# Patient Record
Sex: Female | Born: 1955 | Race: Black or African American | Hispanic: No | Marital: Single | Smoking: Never smoker
Health system: Southern US, Community
[De-identification: ages and names within clinical notes are randomized; demographics above are authoritative.]

## PROBLEM LIST (undated history)

## (undated) DIAGNOSIS — E079 Disorder of thyroid, unspecified: Secondary | ICD-10-CM

## (undated) DIAGNOSIS — K219 Gastro-esophageal reflux disease without esophagitis: Secondary | ICD-10-CM

## (undated) DIAGNOSIS — E0590 Thyrotoxicosis, unspecified without thyrotoxic crisis or storm: Secondary | ICD-10-CM

## (undated) DIAGNOSIS — I071 Rheumatic tricuspid insufficiency: Secondary | ICD-10-CM

## (undated) DIAGNOSIS — E7801 Familial hypercholesterolemia: Secondary | ICD-10-CM

## (undated) DIAGNOSIS — E78019 Familial hypercholesterolemia, unspecified: Secondary | ICD-10-CM

## (undated) DIAGNOSIS — Z6830 Body mass index (BMI) 30.0-30.9, adult: Secondary | ICD-10-CM

## (undated) DIAGNOSIS — R0789 Other chest pain: Secondary | ICD-10-CM

## (undated) DIAGNOSIS — I340 Nonrheumatic mitral (valve) insufficiency: Secondary | ICD-10-CM

## (undated) DIAGNOSIS — I341 Nonrheumatic mitral (valve) prolapse: Secondary | ICD-10-CM

## (undated) DIAGNOSIS — D860 Sarcoidosis of lung: Secondary | ICD-10-CM

## (undated) HISTORY — PX: OTHER SURGICAL HISTORY: SHX169

## (undated) HISTORY — DX: Body mass index (BMI) 30.0-30.9, adult: Z68.30

## (undated) HISTORY — DX: Sarcoidosis of lung: D86.0

## (undated) HISTORY — DX: Thyrotoxicosis, unspecified without thyrotoxic crisis or storm: E05.90

## (undated) HISTORY — DX: Gastro-esophageal reflux disease without esophagitis: K21.9

## (undated) HISTORY — DX: Nonrheumatic mitral (valve) prolapse: I34.1

## (undated) HISTORY — DX: Nonrheumatic mitral (valve) insufficiency: I34.0

## (undated) HISTORY — DX: Familial hypercholesterolemia, unspecified: E78.019

## (undated) HISTORY — DX: Rheumatic tricuspid insufficiency: I07.1

## (undated) HISTORY — DX: Familial hypercholesterolemia: E78.01

---
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
## 2020-05-09 ENCOUNTER — Encounter: Payer: Self-pay | Admitting: Cardiology

## 2020-05-09 ENCOUNTER — Ambulatory Visit: Payer: Medicare Other | Admitting: Cardiology

## 2020-05-09 ENCOUNTER — Other Ambulatory Visit: Payer: Self-pay

## 2020-05-09 VITALS — BP 130/76 | HR 80 | Ht 65.0 in | Wt 238.4 lb

## 2020-05-09 DIAGNOSIS — D869 Sarcoidosis, unspecified: Secondary | ICD-10-CM

## 2020-05-09 DIAGNOSIS — I341 Nonrheumatic mitral (valve) prolapse: Secondary | ICD-10-CM | POA: Diagnosis not present

## 2020-05-09 DIAGNOSIS — R319 Hematuria, unspecified: Secondary | ICD-10-CM

## 2020-05-09 DIAGNOSIS — R42 Dizziness and giddiness: Secondary | ICD-10-CM | POA: Diagnosis not present

## 2020-05-09 DIAGNOSIS — K219 Gastro-esophageal reflux disease without esophagitis: Secondary | ICD-10-CM

## 2020-05-09 DIAGNOSIS — I10 Essential (primary) hypertension: Secondary | ICD-10-CM

## 2020-05-09 NOTE — Progress Notes (Signed)
Cardiology Office Note:    Date:  05/09/2020   ID:  Diana Calhoun, DOB 04/05/1956, MRN [id number]  PCP:  Buzzy Han, MD  [facility] Cardiologist:  Ena Dawley, MD  [place] Electrophysiologist:  None   Referring MD: No ref. provider found   Chief complaint: Shortness of breath, dizziness  History of Present Illness:    Diana Calhoun is a 64 y.o. female with a hx of sarcoidosis, mitral valve prolapse, GERD, who recently moved here from [facility].  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  She has been followed by cardiology is in [facility] until she moved here.  Most recently she underwent calcium scoring in January of this year that showed calcium score of 0.  She was previously followed by pulmonologist for pulmonary sarcoidosis currently on no medication but previously on oral prednisone.  She underwent echocardiography in 2019 that showed LVEF 55%, no wall motion abnormalities, mildly dilated left atrium, mild to moderate mitral regurgitation, mild tricuspid regurgitation with mildly elevated right-sided pressures with RVSP of 41 mmHg.  She states that she is experiencing symptoms of dyspnea on exertion however she attributes it to weight gain since her retirement 5 months ago.  This last weekend she has been experiencing lower back pain with hematuria.  No history of kidney stone.  She just found a primary care physician in [place] area and has appointment scheduled for July 7.  Past Medical History:  Diagnosis Date  . Body mass index (BMI) of 30.0-30.9 in adult   . Chest discomfort   . Familial hypercholesterolemia   . GERD (gastroesophageal reflux disease)   . Mitral valve regurgitation   . Pulmonary sarcoidosis (HCC)    Current Medications: Current Meds  Medication Sig  . fluticasone furoate-vilanterol (BREO ELLIPTA) 100-25 MCG/INH AEPB Place 1 puff into  the nose in the morning and at bedtime.  . montelukast (SINGULAIR) 10 MG tablet Take 10 mg by mouth at bedtime as needed.   . naproxen sodium (ALEVE) 220 MG tablet Take 220 mg by mouth as needed.  . pantoprazole (PROTONIX) 40 MG tablet Take 40 mg by mouth as needed.   . predniSONE (DELTASONE) 20 MG tablet Take 20 mg by mouth as needed.    Allergies:   Patient has no known allergies.   Social History   Socioeconomic History  . Marital status: Single    Spouse name: Not on file  . Number of children: Not on file  . Years of education: Not on file  . Highest education level: Not on file  Occupational History  . Not on file  Tobacco Use  . Smoking status: Never Smoker  . Smokeless tobacco: Never Used  Substance and Sexual Activity  . Alcohol use: Not on file  . Drug use: Not on file  . Sexual activity: Not on file  Other Topics Concern  . Not on file  Social History Narrative  . Not on file   Social Determinants of Health   Financial Resource Strain:   . Difficulty of Paying Living Expenses:   Food Insecurity:   . Worried About Charity fundraiser in the Last Year:   . Arboriculturist in the Last Year:   Transportation Needs:   . Film/video editor (Medical):   Marland Kitchen Lack of Transportation (Non-Medical):   Physical Activity:   . Days of Exercise per Week:   . Minutes of Exercise per Session:  Stress:   . Feeling of Stress :   Social Connections:   . Frequency of Communication with Friends and Family:   . Frequency of Social Gatherings with Friends and Family:   . Attends Religious Services:   . Active Member of Clubs or Organizations:   . Attends Archivist Meetings:   Marland Kitchen Marital Status:      Family History: The patient's family history includes Diabetes in her sister and sister; Hypertension in her mother.  ROS:   Please see the history of present illness.    All other systems reviewed and are negative.  EKGs/Labs/Other Studies Reviewed:    The  following studies were reviewed today:  EKG:  EKG is ordered today.  The ekg ordered today demonstrates normal sinus rhythm, normal EKG, no prior EKG available for comparison.  This was personally reviewed.  Recent Labs: No results found for requested labs within last 8760 hours.  Recent Lipid Panel No results found for: CHOL, TRIG, HDL, CHOLHDL, VLDL, LDLCALC, LDLDIRECT  Physical Exam:    VS:  BP 130/76   Pulse 80   Ht '5\' 5"'$  (1.651 m)   Wt 238 lb 6.4 oz (108.1 kg)   SpO2 96%   BMI 39.67 kg/m     Wt Readings from Last 3 Encounters:  05/09/20 238 lb 6.4 oz (108.1 kg)    GEN:  Well nourished, well developed in no acute distress HEENT: Normal NECK: No JVD; No carotid bruits LYMPHATICS: No lymphadenopathy CARDIAC: RRR, 3/6 systolic murmurs, rubs, gallops RESPIRATORY:  Clear to auscultation without rales, wheezing or rhonchi  ABDOMEN: Soft, non-tender, non-distended MUSCULOSKELETAL:  No edema; No deformity  SKIN: Warm and dry NEUROLOGIC:  Alert and oriented x 3 PSYCHIATRIC:  Normal affect    ASSESSMENT:    1. MVP (mitral valve prolapse)   2. Sarcoidosis   3. Dizziness   4. Essential hypertension   5. Gastroesophageal reflux disease, unspecified whether esophagitis present   6. Hematuria, unspecified type    PLAN:    In order of problems listed above:  1. Mitral valve prolapse with mild to moderate mitral regurgitation in 2019, we will repeat echocardiogram to get a baseline echocardiogram at [facility]. 2. Pulmonary sarcoidosis, no evidence of cardiac sarcoidosis, she has no palpitations or evidence for ventricular tachycardia's, if she starts having any we will obtain cardiac MRI to evaluate for cardiac involvement.  We will refer to pulmonary. 3. Poorly controlled GERD with esophagitis, will refer to a GI doctor. 4. Family history of brain aneurysm -with some neck pain, will obtain carotid ultrasound.   Medication Adjustments/Labs and Tests Ordered: Current medicines  are reviewed at length with the patient today.  Concerns regarding medicines are outlined above.  Orders Placed This Encounter  Procedures  . Comp Met (CMET)  . CBC  . TSH  . Lipid Profile  . Urinalysis  . Ambulatory referral to Pulmonology  . Ambulatory referral to Gastroenterology  . EKG 12-Lead  . ECHOCARDIOGRAM COMPLETE  . VAS US CAROTID   No orders of the defined types were placed in this encounter.   Patient Instructions  Medication Instructions:   Your physician recommends that you continue on your current medications as directed. Please refer to the Current Medication list given to you today.  *If you need a refill on your cardiac medications before your next appointment, please call your pharmacy*   Lab Work:  SAME DAY AS YOUR ECHO IS SCHEDULED--WE WILL CHECK URINALYSIS, CMET, CBC, TSH, AND LIPIDS--PLEASE  COME FASTING TO THIS LAB APPOINTMENT  If you have labs (blood work) drawn today and your tests are completely normal, you will receive your results only by: Marland Kitchen MyChart Message (if you have MyChart) OR . A paper copy in the mail If you have any lab test that is abnormal or we need to change your treatment, we will call you to review the results.  Testing/Procedures:  Your physician has requested that you have an echocardiogram. Echocardiography is a painless test that uses sound waves to create images of your heart. It provides your doctor with information about the size and shape of your heart and how well your heart's chambers and valves are working. This procedure takes approximately one hour. There are no restrictions for this procedure. PLEASE SCHEDULE LABS SAME DAY AS THIS APPOINTMENT  Your physician has requested that you have a carotid duplex. This test is an ultrasound of the carotid arteries in your neck. It looks at blood flow through these arteries that supply the brain with blood. Allow one hour for this exam. There are no restrictions or special  instructions.  You have been referred to [place] have been referred to [place] [facility]   Follow-Up: At [facility], you and your health needs are our priority.  As part of our continuing mission to provide you with exceptional heart care, we have created designated Provider Care Teams.  These Care Teams include your primary Cardiologist (physician) and Advanced Practice Providers (APPs -  Physician Assistants and Nurse Practitioners) who all work together to provide you with the care you need, when you need it.  We recommend signing up for the patient portal called "MyChart".  Sign up information is provided on this After Visit Summary.  MyChart is used to connect with patients for Virtual Visits (Telemedicine).  Patients are able to view lab/test results, encounter notes, upcoming appointments, etc.  Non-urgent messages can be sent to your provider as well.   To learn more about what you can do with MyChart, go to [email or web address].    Your next appointment:   12 month(s)  The format for your next appointment:   In Person  Provider:   Ena Dawley, MD       Signed, Ena Dawley, MD  05/09/2020 1:32 PM    [place]

## 2020-05-09 NOTE — Patient Instructions (Signed)
Medication Instructions:   Your physician recommends that you continue on your current medications as directed. Please refer to the Current Medication list given to you today.  *If you need a refill on your cardiac medications before your next appointment, please call your pharmacy*   Lab Work:  SAME DAY AS YOUR ECHO IS SCHEDULED--WE WILL CHECK URINALYSIS, CMET, CBC, TSH, AND LIPIDS--PLEASE COME FASTING TO THIS LAB APPOINTMENT  If you have labs (blood work) drawn today and your tests are completely normal, you will receive your results only by: Marland Kitchen MyChart Message (if you have MyChart) OR . A paper copy in the mail If you have any lab test that is abnormal or we need to change your treatment, we will call you to review the results.  Testing/Procedures:  Your physician has requested that you have an echocardiogram. Echocardiography is a painless test that uses sound waves to create images of your heart. It provides your doctor with information about the size and shape of your heart and how well your heart's chambers and valves are working. This procedure takes approximately one hour. There are no restrictions for this procedure. PLEASE SCHEDULE LABS SAME DAY AS THIS APPOINTMENT  Your physician has requested that you have a carotid duplex. This test is an ultrasound of the carotid arteries in your neck. It looks at blood flow through these arteries that supply the brain with blood. Allow one hour for this exam. There are no restrictions or special instructions.  You have been referred to [facility] PULMONOLOGY FOR SARCOIDOSIS  You have been referred to [facility] GASTROENTEROLOGY FOR GERD   Follow-Up: At [facility], you and your health needs are our priority.  As part of our continuing mission to provide you with exceptional heart care, we have created designated Provider Care Teams.  These Care Teams include your primary Cardiologist (physician) and Advanced Practice Providers (APPs -   Physician Assistants and Nurse Practitioners) who all work together to provide you with the care you need, when you need it.  We recommend signing up for the patient portal called "MyChart".  Sign up information is provided on this After Visit Summary.  MyChart is used to connect with patients for Virtual Visits (Telemedicine).  Patients are able to view lab/test results, encounter notes, upcoming appointments, etc.  Non-urgent messages can be sent to your provider as well.   To learn more about what you can do with MyChart, go to [email or web address].    Your next appointment:   12 month(s)  The format for your next appointment:   In Person  Provider:   Tobias Alexander, MD

## 2020-05-23 ENCOUNTER — Other Ambulatory Visit: Payer: Self-pay | Admitting: Family Medicine

## 2020-05-23 DIAGNOSIS — Z1231 Encounter for screening mammogram for malignant neoplasm of breast: Secondary | ICD-10-CM

## 2020-05-23 DIAGNOSIS — Z13820 Encounter for screening for osteoporosis: Secondary | ICD-10-CM

## 2020-05-24 ENCOUNTER — Other Ambulatory Visit: Payer: Self-pay | Admitting: Family Medicine

## 2020-05-24 DIAGNOSIS — K828 Other specified diseases of gallbladder: Secondary | ICD-10-CM

## 2020-05-31 ENCOUNTER — Other Ambulatory Visit: Payer: Medicare Other | Admitting: *Deleted

## 2020-05-31 ENCOUNTER — Ambulatory Visit (HOSPITAL_COMMUNITY): Payer: Medicare Other | Attending: Internal Medicine

## 2020-05-31 ENCOUNTER — Other Ambulatory Visit: Payer: Self-pay

## 2020-05-31 DIAGNOSIS — I341 Nonrheumatic mitral (valve) prolapse: Secondary | ICD-10-CM | POA: Insufficient documentation

## 2020-05-31 DIAGNOSIS — D869 Sarcoidosis, unspecified: Secondary | ICD-10-CM | POA: Diagnosis present

## 2020-05-31 DIAGNOSIS — I10 Essential (primary) hypertension: Secondary | ICD-10-CM | POA: Insufficient documentation

## 2020-05-31 DIAGNOSIS — R42 Dizziness and giddiness: Secondary | ICD-10-CM

## 2020-05-31 DIAGNOSIS — R319 Hematuria, unspecified: Secondary | ICD-10-CM

## 2020-05-31 DIAGNOSIS — K219 Gastro-esophageal reflux disease without esophagitis: Secondary | ICD-10-CM

## 2020-05-31 LAB — CBC
Hematocrit: 34.7 % (ref 34.0–46.6)
Hemoglobin: 11.7 g/dL (ref 11.1–15.9)
MCH: 29.3 pg (ref 26.6–33.0)
MCHC: 33.7 g/dL (ref 31.5–35.7)
MCV: 87 fL (ref 79–97)
Platelets: 157 10*3/uL (ref 150–450)
RBC: 3.99 x10E6/uL (ref 3.77–5.28)
RDW: 12 % (ref 11.7–15.4)
WBC: 3.3 10*3/uL — ABNORMAL LOW (ref 3.4–10.8)

## 2020-05-31 LAB — LIPID PANEL
Chol/HDL Ratio: 3.3 ratio (ref 0.0–4.4)
Cholesterol, Total: 181 mg/dL (ref 100–199)
HDL: 55 mg/dL (ref >39)
LDL Chol Calc (NIH): 114 mg/dL — ABNORMAL HIGH (ref 0–99)
Triglycerides: 63 mg/dL (ref 0–149)
VLDL Cholesterol Cal: 12 mg/dL (ref 5–40)

## 2020-05-31 LAB — URINALYSIS
Bilirubin, UA: NEGATIVE
Glucose, UA: NEGATIVE
Ketones, UA: NEGATIVE
Leukocytes,UA: NEGATIVE
Nitrite, UA: NEGATIVE
Protein,UA: NEGATIVE
RBC, UA: NEGATIVE
Specific Gravity, UA: 1.017 (ref 1.005–1.030)
Urobilinogen, Ur: 0.2 mg/dL (ref 0.2–1.0)
pH, UA: 5.5 (ref 5.0–7.5)

## 2020-05-31 LAB — COMPREHENSIVE METABOLIC PANEL
ALT: 23 IU/L (ref 0–32)
AST: 22 IU/L (ref 0–40)
Albumin/Globulin Ratio: 1.2 (ref 1.2–2.2)
Albumin: 3.9 g/dL (ref 3.8–4.8)
Alkaline Phosphatase: 55 IU/L (ref 48–121)
BUN/Creatinine Ratio: 18 (ref 12–28)
BUN: 16 mg/dL (ref 8–27)
Bilirubin Total: 0.2 mg/dL (ref 0.0–1.2)
CO2: 23 mmol/L (ref 20–29)
Calcium: 8.8 mg/dL (ref 8.7–10.3)
Chloride: 105 mmol/L (ref 96–106)
Creatinine, Ser: 0.88 mg/dL (ref 0.57–1.00)
GFR calc Af Amer: 80 mL/min/{1.73_m2} (ref >59)
GFR calc non Af Amer: 69 mL/min/{1.73_m2} (ref >59)
Globulin, Total: 3.2 g/dL (ref 1.5–4.5)
Glucose: 97 mg/dL (ref 65–99)
Potassium: 4 mmol/L (ref 3.5–5.2)
Sodium: 140 mmol/L (ref 134–144)
Total Protein: 7.1 g/dL (ref 6.0–8.5)

## 2020-05-31 LAB — TSH: TSH: 0.603 u[IU]/mL (ref 0.450–4.500)

## 2020-05-31 MED ORDER — PERFLUTREN LIPID MICROSPHERE
1.0000 mL | INTRAVENOUS | Status: AC | PRN
  Administered 2020-05-31: 1 mL via INTRAVENOUS

## 2020-06-01 ENCOUNTER — Telehealth: Payer: Self-pay

## 2020-06-01 MED ORDER — RED YEAST RICE 600 MG PO TABS: 600.0000 mg | ORAL_TABLET | Freq: Every day | ORAL | 12 refills | Status: DC

## 2020-06-01 NOTE — Telephone Encounter (Signed)
Will send in the RX for the OTC Red Yeast Rice so the pharmacist can help her find it on the shelf per her request.

## 2020-06-01 NOTE — Telephone Encounter (Signed)
-----   Message from Lars Masson, MD sent at 06/01/2020  9:11 AM EDT ----- Normal labs except for mildly elevated LDL, I would suggest OTC Red yeast rice 600 mg po daily

## 2020-06-04 NOTE — Progress Notes (Signed)
Cardiology Office Note    Date:  06/05/2020   ID:  Diana Calhoun, DOB [birth year], MRN [id number]  PCP:  Margot Ables, MD  Cardiologist: Tobias Alexander, MD EPS: None  Chief Complaint  Patient presents with  . Follow-up    History of Present Illness:  Diana Calhoun is a 64 y.o. 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  Calcium score in January 2021 was 0 echo 2019 LVEF 55% mildly dilated left atrium, mild to moderate MR, mild TR mildly elevated right-sided pressures with RVSP of 41 mmHg.  Patient saw Dr. Delton See 05/09/2020 and was complaining of dyspnea on exertion and weight gain.  Patient was not having palpitations or evidence of ventricular tachycardias but if she does Dr. Delton See recommends MRI to evaluate for cardiac involvement.  She was referred to pulmonary.  The echo 05/31/2020 showed normal LVEF 60 to 65% with grade 2 DD, moderate posterior leaflet calcification of the mitral valve with prolapse of the AMVL with posteriorly directed MR hugs the atrial wall suspect moderate to severe MR and recommend TEE.  Patient wanted to be seen before scheduling TEE so was added onto my schedule.  Patient has chronic dyspnea on exertion that is unchanged. Denies chest pain, palpitations, dizziness or presyncope. All questions answered.  Past Medical History:  Diagnosis Date  . Body mass index (BMI) of 30.0-30.9 in adult   . Chest discomfort   . Familial hypercholesterolemia   . GERD (gastroesophageal reflux disease)   . Mitral valve regurgitation   . Pulmonary sarcoidosis [facility])     Past Surgical History:  Procedure Laterality Date  . CESAREAN SECTION    . PERCARDIUM INFUSION      Current Medications: Current Meds  Medication Sig  . fluticasone furoate-vilanterol (BREO ELLIPTA) 100-25  MCG/INH AEPB Place 1 puff into the nose in the morning and at bedtime.  . montelukast (SINGULAIR) 10 MG tablet Take 10 mg by mouth at bedtime as needed.   . Red Yeast Rice 600 MG TABS Take 1 tablet (600 mg total) by mouth daily.     Allergies:   Sulfa antibiotics   Social History   Socioeconomic History  . Marital status: Single    Spouse name: Not on file  . Number of children: Not on file  . Years of education: Not on file  . Highest education level: Not on file  Occupational History  . Not on file  Tobacco Use  . Smoking status: Never Smoker  . Smokeless tobacco: Never Used  Substance and Sexual Activity  . Alcohol use: Not on file  . Drug use: Not on file  . Sexual activity: Not on file  Other Topics Concern  . Not on file  Social History Narrative  . Not on file   Social Determinants of Health   Financial Resource Strain:   . Difficulty of Paying Living Expenses:   Food Insecurity:   . Worried About Programme researcher, broadcasting/film/video in the Last Year:   . Barista in the Last Year:   Transportation Needs:   . Freight forwarder (Medical):   Marland Kitchen Lack of Transportation (Non-Medical):   Physical Activity:   . Days of Exercise per Week:   . Minutes of Exercise per Session:   Stress:   . Feeling of Stress :   Social Connections:   .  Frequency of Communication with Friends and Family:   . Frequency of Social Gatherings with Friends and Family:   . Attends Religious Services:   . Active Member of Clubs or Organizations:   . Attends BankerClub or Organization Meetings:   Marland Kitchen. Marital Status:      Family History:  The patient's   family history includes Diabetes in her sister and sister; Hypertension in her mother.   ROS:   Please see the history of present illness.    ROS All other systems reviewed and are negative.   PHYSICAL EXAM:   VS:  BP 128/72   Pulse 75   Ht 5\' 5"  (1.651 m)   Wt 237 lb (107.5 kg)   SpO2 98%   BMI 39.44 kg/m   Physical Exam  GEN: Well  nourished, well developed, in no acute distress  HEENT: normal  Neck: no JVD, carotid bruits, or masses Cardiac:RRR; 3/6 systolic murmur apex Respiratory:  clear to auscultation bilaterally, normal work of breathing GI: soft, nontender, nondistended, + BS Ext: without cyanosis, clubbing, or edema, Good distal pulses bilaterally Neuro:  Alert and Oriented x 3 Psych: euthymic mood, full affect  Wt Readings from Last 3 Encounters:  06/05/20 237 lb (107.5 kg)  05/09/20 238 lb 6.4 oz (108.1 kg)      Studies/Labs Reviewed:   EKG:  EKG is  ordered today.  The ekg ordered today demonstrates Wandering atrial pacemaker  Recent Labs: 05/31/2020: ALT 23; BUN 16; Creatinine, Ser 0.88; Hemoglobin 11.7; Platelets 157; Potassium 4.0; Sodium 140; TSH 0.603   Lipid Panel    Component Value Date/Time   CHOL 181 05/31/2020 0818   TRIG 63 05/31/2020 0818   HDL 55 05/31/2020 0818   CHOLHDL 3.3 05/31/2020 0818   LDLCALC 114 (H) 05/31/2020 0818    Additional studies/ records that were reviewed today include:  2D echo 05/31/2020 IMPRESSIONS     1. Left ventricular ejection fraction, by estimation, is 60 to 65%. The  left ventricle has normal function. The left ventricle has no regional  wall motion abnormalities. Left ventricular diastolic parameters are  consistent with Grade II diastolic  dysfunction (pseudonormalization). Elevated left ventricular end-diastolic  pressure.   2. Right ventricular systolic function is normal. The right ventricular  size is normal. There is normal pulmonary artery systolic pressure. The  estimated right ventricular systolic pressure is 33.2 mmHg.   3. Moderate posterior leaflet calcification of the mitral valve  leaflet(s). Late systolic prolapse of the AMVL with posteriorly directed  MR that hugs the atrial wall. Recommend further evaluation with TEE for  suspected severe MR.   4. The mitral valve is abnormal. Moderate to probaby severe mitral valve   regurgitation.   5. The aortic valve is tricuspid. Aortic valve regurgitation is not  visualized.   6. Left atrial size was moderately dilated.   7. The inferior vena cava is normal in size with <50% respiratory  variability, suggesting right atrial pressure of 8 mmHg.   Comparison(s): 02/11/18 Mild-moderate MR.   FINDINGS   Left Ventricle: Left ventricular ejection fraction, by estimation, is 60  to 65%. The left ventricle has normal function. The left ventricle has no  regional wall motion abnormalities. Definity contrast agent was given IV  to delineate the left ventricular   endocardial borders. The left ventricular internal cavity size was normal  in size. There is no left ventricular hypertrophy. Left ventricular  diastolic parameters are consistent with Grade II diastolic dysfunction  (pseudonormalization). Elevated  left  ventricular end-diastolic pressure.   Right Ventricle: The right ventricular size is normal. No increase in  right ventricular wall thickness. Right ventricular systolic function is  normal. There is normal pulmonary artery systolic pressure. The tricuspid  regurgitant velocity is 2.51 m/s, and   with an assumed right atrial pressure of 8 mmHg, the estimated right  ventricular systolic pressure is 33.2 mmHg.   Left Atrium: Left atrial size was moderately dilated.   Right Atrium: Right atrial size was normal in size.   Pericardium: There is no evidence of pericardial effusion.   Mitral Valve: The mitral valve is abnormal. There is moderate late  systolic prolapse of multiple segments of the anterior leaflet of the  mitral valve. There is mild thickening of the mitral valve leaflet(s).  There is moderate posterior leaflet  calcification of the mitral valve leaflet(s). Moderate to severe mitral  valve regurgitation, with posteriorly-directed jet.   Tricuspid Valve: The tricuspid valve is grossly normal. Tricuspid valve  regurgitation is trivial.    Aortic Valve: The aortic valve is tricuspid. Aortic valve regurgitation is  not visualized.   Pulmonic Valve: The pulmonic valve was grossly normal. Pulmonic valve  regurgitation is trivial.   Aorta: The aortic root and ascending aorta are structurally normal, with  no evidence of dilitation.   Venous: The inferior vena cava is normal in size with less than 50%  respiratory variability, suggesting right atrial pressure of 8 mmHg.   IAS/Shunts: No atrial level shunt detected by color flow Doppler.         ASSESSMENT:    1. MVP (mitral valve prolapse)   2. Mitral valve insufficiency, unspecified etiology   3. Sarcoidosis      PLAN:  In order of problems listed above:  Mitral valve prolapse with moderate to severe MR on echo 05/31/2020.  Dr. Delton See recommends TEE. All questions answered and patient willing to proceed.  Pulmonary sarcoidosis status post pericardial window  Family history of brain aneurysm carotid ultrasound ordered pending    Medication Adjustments/Labs and Tests Ordered: Current medicines are reviewed at length with the patient today.  Concerns regarding medicines are outlined above.  Medication changes, Labs and Tests ordered today are listed in the Patient Instructions below. Patient Instructions  Medication Instructions:  Your physician recommends that you continue on your current medications as directed. Please refer to the Current Medication list given to you today.  *If you need a refill on your cardiac medications before your next appointment, please call your pharmacy*   Lab Work: None If you have labs (blood work) drawn today and your tests are completely normal, you will receive your results only by: Marland Kitchen MyChart Message (if you have MyChart) OR . A paper copy in the mail If you have any lab test that is abnormal or we need to change your treatment, we will call you to review the results.   Testing/Procedures: Your physician has requested  that you have a TEE. During a TEE, sound waves are used to create images of your heart. It provides your doctor with information about the size and shape of your heart and how well your heart's chambers and valves are working. In this test, a transducer is attached to the end of a flexible tube that's guided down your throat and into your esophagus (the tube leading from you mouth to your stomach) to get a more detailed image of your heart. You are not awake for the procedure. Please see the instruction  sheet given to you today. For further information please visit [email or web address]      Follow-Up: At [facility], you and your health needs are our priority.  As part of our continuing mission to provide you with exceptional heart care, we have created designated Provider Care Teams.  These Care Teams include your primary Cardiologist (physician) and Advanced Practice Providers (APPs -  Physician Assistants and Nurse Practitioners) who all work together to provide you with the care you need, when you need it.  We recommend signing up for the patient portal called "MyChart".  Sign up information is provided on this After Visit Summary.  MyChart is used to connect with patients for Virtual Visits (Telemedicine).  Patients are able to view lab/test results, encounter notes, upcoming appointments, etc.  Non-urgent messages can be sent to your provider as well.   To learn more about what you can do with MyChart, go to [email or web address].    Your next appointment:   Will be based on results   Other Instructions  You are scheduled for a TEE/Cardioversion/TEE Cardioversion on June 11, 2020 with Dr. Eden Emms.  Please arrive at the [facility] (Main Entrance A) at [facility]: [address] [place], [place] [address] at 8:30 am/pm. (1 hour prior to procedure unless lab work is needed; if lab work is needed arrive 1.5 hours ahead)  DIET: Nothing to eat or drink after midnight except a sip of  water with medications (see medication instructions below)   You must have a responsible person to drive you home and stay in the waiting area during your procedure. Failure to do so could result in cancellation.  Bring your insurance cards.  *Special Note: Every effort is made to have your procedure done on time. Occasionally there are emergencies that occur at the hospital that may cause delays. Please be patient if a delay does occur.       Signed, Jacolyn Reedy, PA-C  06/05/2020 1:02 PM    [facility] [address] [place], [place], [place]  [address] Phone: [phone]; Fax: [phone]

## 2020-06-04 NOTE — H&P (View-Only) (Signed)
 Cardiology Office Note    Date:  06/05/2020   ID:  Diana Calhoun, DOB 06/18/1956, MRN [id number]  PCP:  Okwubunka-Anyim, Ahunna, MD  Cardiologist: Katarina Nelson, MD EPS: None  Chief Complaint  Patient presents with  . Follow-up    History of Present Illness:  Diana Calhoun is a 63 y.o. 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  Calcium score in January 2021 was 0 echo 2019 LVEF 55% mildly dilated left atrium, mild to moderate MR, mild TR mildly elevated right-sided pressures with RVSP of 41 mmHg.  Patient saw Dr. Nelson 05/09/2020 and was complaining of dyspnea on exertion and weight gain.  Patient was not having palpitations or evidence of ventricular tachycardias but if she does Dr. Nelson recommends MRI to evaluate for cardiac involvement.  She was referred to pulmonary.  The echo 05/31/2020 showed normal LVEF 60 to 65% with grade 2 DD, moderate posterior leaflet calcification of the mitral valve with prolapse of the AMVL with posteriorly directed MR hugs the atrial wall suspect moderate to severe MR and recommend TEE.  Patient wanted to be seen before scheduling TEE so was added onto my schedule.  Patient has chronic dyspnea on exertion that is unchanged. Denies chest pain, palpitations, dizziness or presyncope. All questions answered.  Past Medical History:  Diagnosis Date  . Body mass index (BMI) of 30.0-30.9 in adult   . Chest discomfort   . Familial hypercholesterolemia   . GERD (gastroesophageal reflux disease)   . Mitral valve regurgitation   . Pulmonary sarcoidosis (HCC)     Past Surgical History:  Procedure Laterality Date  . CESAREAN SECTION    . PERCARDIUM INFUSION      Current Medications: Current Meds  Medication Sig  . fluticasone furoate-vilanterol (BREO ELLIPTA) 100-25  MCG/INH AEPB Place 1 puff into the nose in the morning and at bedtime.  . montelukast (SINGULAIR) 10 MG tablet Take 10 mg by mouth at bedtime as needed.   . Red Yeast Rice 600 MG TABS Take 1 tablet (600 mg total) by mouth daily.     Allergies:   Sulfa antibiotics   Social History   Socioeconomic History  . Marital status: Single    Spouse name: Not on file  . Number of children: Not on file  . Years of education: Not on file  . Highest education level: Not on file  Occupational History  . Not on file  Tobacco Use  . Smoking status: Never Smoker  . Smokeless tobacco: Never Used  Substance and Sexual Activity  . Alcohol use: Not on file  . Drug use: Not on file  . Sexual activity: Not on file  Other Topics Concern  . Not on file  Social History Narrative  . Not on file   Social Determinants of Health   Financial Resource Strain:   . Difficulty of Paying Living Expenses:   Food Insecurity:   . Worried About Running Out of Food in the Last Year:   . Ran Out of Food in the Last Year:   Transportation Needs:   . Lack of Transportation (Medical):   . Lack of Transportation (Non-Medical):   Physical Activity:   . Days of Exercise per Week:   . Minutes of Exercise per Session:   Stress:   . Feeling of Stress :   Social Connections:   .   Frequency of Communication with Friends and Family:   . Frequency of Social Gatherings with Friends and Family:   . Attends Religious Services:   . Active Member of Clubs or Organizations:   . Attends Club or Organization Meetings:   . Marital Status:      Family History:  The patient's   family history includes Diabetes in her sister and sister; Hypertension in her mother.   ROS:   Please see the history of present illness.    ROS All other systems reviewed and are negative.   PHYSICAL EXAM:   VS:  BP 128/72   Pulse 75   Ht 5' 5" (1.651 m)   Wt 237 lb (107.5 kg)   SpO2 98%   BMI 39.44 kg/m   Physical Exam  GEN: Well  nourished, well developed, in no acute distress  HEENT: normal  Neck: no JVD, carotid bruits, or masses Cardiac:RRR; 3/6 systolic murmur apex Respiratory:  clear to auscultation bilaterally, normal work of breathing GI: soft, nontender, nondistended, + BS Ext: without cyanosis, clubbing, or edema, Good distal pulses bilaterally Neuro:  Alert and Oriented x 3 Psych: euthymic mood, full affect  Wt Readings from Last 3 Encounters:  06/05/20 237 lb (107.5 kg)  05/09/20 238 lb 6.4 oz (108.1 kg)      Studies/Labs Reviewed:   EKG:  EKG is  ordered today.  The ekg ordered today demonstrates Wandering atrial pacemaker  Recent Labs: 05/31/2020: ALT 23; BUN 16; Creatinine, Ser 0.88; Hemoglobin 11.7; Platelets 157; Potassium 4.0; Sodium 140; TSH 0.603   Lipid Panel    Component Value Date/Time   CHOL 181 05/31/2020 0818   TRIG 63 05/31/2020 0818   HDL 55 05/31/2020 0818   CHOLHDL 3.3 05/31/2020 0818   LDLCALC 114 (H) 05/31/2020 0818    Additional studies/ records that were reviewed today include:  2D echo 05/31/2020 IMPRESSIONS     1. Left ventricular ejection fraction, by estimation, is 60 to 65%. The  left ventricle has normal function. The left ventricle has no regional  wall motion abnormalities. Left ventricular diastolic parameters are  consistent with Grade II diastolic  dysfunction (pseudonormalization). Elevated left ventricular end-diastolic  pressure.   2. Right ventricular systolic function is normal. The right ventricular  size is normal. There is normal pulmonary artery systolic pressure. The  estimated right ventricular systolic pressure is 33.2 mmHg.   3. Moderate posterior leaflet calcification of the mitral valve  leaflet(s). Late systolic prolapse of the AMVL with posteriorly directed  MR that hugs the atrial wall. Recommend further evaluation with TEE for  suspected severe MR.   4. The mitral valve is abnormal. Moderate to probaby severe mitral valve   regurgitation.   5. The aortic valve is tricuspid. Aortic valve regurgitation is not  visualized.   6. Left atrial size was moderately dilated.   7. The inferior vena cava is normal in size with <50% respiratory  variability, suggesting right atrial pressure of 8 mmHg.   Comparison(s): 02/11/18 Mild-moderate MR.   FINDINGS   Left Ventricle: Left ventricular ejection fraction, by estimation, is 60  to 65%. The left ventricle has normal function. The left ventricle has no  regional wall motion abnormalities. Definity contrast agent was given IV  to delineate the left ventricular   endocardial borders. The left ventricular internal cavity size was normal  in size. There is no left ventricular hypertrophy. Left ventricular  diastolic parameters are consistent with Grade II diastolic dysfunction  (pseudonormalization). Elevated   left  ventricular end-diastolic pressure.   Right Ventricle: The right ventricular size is normal. No increase in  right ventricular wall thickness. Right ventricular systolic function is  normal. There is normal pulmonary artery systolic pressure. The tricuspid  regurgitant velocity is 2.51 m/s, and   with an assumed right atrial pressure of 8 mmHg, the estimated right  ventricular systolic pressure is 33.2 mmHg.   Left Atrium: Left atrial size was moderately dilated.   Right Atrium: Right atrial size was normal in size.   Pericardium: There is no evidence of pericardial effusion.   Mitral Valve: The mitral valve is abnormal. There is moderate late  systolic prolapse of multiple segments of the anterior leaflet of the  mitral valve. There is mild thickening of the mitral valve leaflet(s).  There is moderate posterior leaflet  calcification of the mitral valve leaflet(s). Moderate to severe mitral  valve regurgitation, with posteriorly-directed jet.   Tricuspid Valve: The tricuspid valve is grossly normal. Tricuspid valve  regurgitation is trivial.    Aortic Valve: The aortic valve is tricuspid. Aortic valve regurgitation is  not visualized.   Pulmonic Valve: The pulmonic valve was grossly normal. Pulmonic valve  regurgitation is trivial.   Aorta: The aortic root and ascending aorta are structurally normal, with  no evidence of dilitation.   Venous: The inferior vena cava is normal in size with less than 50%  respiratory variability, suggesting right atrial pressure of 8 mmHg.   IAS/Shunts: No atrial level shunt detected by color flow Doppler.         ASSESSMENT:    1. MVP (mitral valve prolapse)   2. Mitral valve insufficiency, unspecified etiology   3. Sarcoidosis      PLAN:  In order of problems listed above:  Mitral valve prolapse with moderate to severe MR on echo 05/31/2020.  Dr. Nelson recommends TEE. All questions answered and patient willing to proceed.  Pulmonary sarcoidosis status post pericardial window  Family history of brain aneurysm carotid ultrasound ordered pending    Medication Adjustments/Labs and Tests Ordered: Current medicines are reviewed at length with the patient today.  Concerns regarding medicines are outlined above.  Medication changes, Labs and Tests ordered today are listed in the Patient Instructions below. Patient Instructions  Medication Instructions:  Your physician recommends that you continue on your current medications as directed. Please refer to the Current Medication list given to you today.  *If you need a refill on your cardiac medications before your next appointment, please call your pharmacy*   Lab Work: None If you have labs (blood work) drawn today and your tests are completely normal, you will receive your results only by: . MyChart Message (if you have MyChart) OR . A paper copy in the mail If you have any lab test that is abnormal or we need to change your treatment, we will call you to review the results.   Testing/Procedures: Your physician has requested  that you have a TEE. During a TEE, sound waves are used to create images of your heart. It provides your doctor with information about the size and shape of your heart and how well your heart's chambers and valves are working. In this test, a transducer is attached to the end of a flexible tube that's guided down your throat and into your esophagus (the tube leading from you mouth to your stomach) to get a more detailed image of your heart. You are not awake for the procedure. Please see the instruction   sheet given to you today. For further information please visit [email or web address].      Follow-Up: At [facility], you and your health needs are our priority.  As part of our continuing mission to provide you with exceptional heart care, we have created designated Provider Care Teams.  These Care Teams include your primary Cardiologist (physician) and Advanced Practice Providers (APPs -  Physician Assistants and Nurse Practitioners) who all work together to provide you with the care you need, when you need it.  We recommend signing up for the patient portal called "MyChart".  Sign up information is provided on this After Visit Summary.  MyChart is used to connect with patients for Virtual Visits (Telemedicine).  Patients are able to view lab/test results, encounter notes, upcoming appointments, etc.  Non-urgent messages can be sent to your provider as well.   To learn more about what you can do with MyChart, go to [email or web address].    Your next appointment:   Will be based on results   Other Instructions  You are scheduled for a TEE/Cardioversion/TEE Cardioversion on June 11, 2020 with Dr. Nishan.  Please arrive at the [facility] (Main Entrance A) at [facility]: [address] [place], [place] [address] at 8:30 am/pm. (1 hour prior to procedure unless lab work is needed; if lab work is needed arrive 1.5 hours ahead)  DIET: Nothing to eat or drink after midnight except a sip of  water with medications (see medication instructions below)   You must have a responsible person to drive you home and stay in the waiting area during your procedure. Failure to do so could result in cancellation.  Bring your insurance cards.  *Special Note: Every effort is made to have your procedure done on time. Occasionally there are emergencies that occur at the hospital that may cause delays. Please be patient if a delay does occur.       Signed, Princesa Willig, PA-C  06/05/2020 1:02 PM    [facility] [address], [place], [place]  [address] Phone: [phone]; Fax: [phone]    

## 2020-06-05 ENCOUNTER — Ambulatory Visit (INDEPENDENT_AMBULATORY_CARE_PROVIDER_SITE_OTHER): Payer: Medicare Other | Admitting: Physician Assistant

## 2020-06-05 ENCOUNTER — Other Ambulatory Visit: Payer: Self-pay

## 2020-06-05 ENCOUNTER — Encounter: Payer: Self-pay | Admitting: Physician Assistant

## 2020-06-05 VITALS — BP 128/72 | HR 75 | Ht 65.0 in | Wt 237.0 lb

## 2020-06-05 DIAGNOSIS — I341 Nonrheumatic mitral (valve) prolapse: Secondary | ICD-10-CM

## 2020-06-05 DIAGNOSIS — D869 Sarcoidosis, unspecified: Secondary | ICD-10-CM

## 2020-06-05 DIAGNOSIS — I340 Nonrheumatic mitral (valve) insufficiency: Secondary | ICD-10-CM | POA: Diagnosis not present

## 2020-06-05 NOTE — Patient Instructions (Signed)
Medication Instructions:  Your physician recommends that you continue on your current medications as directed. Please refer to the Current Medication list given to you today.  *If you need a refill on your cardiac medications before your next appointment, please call your pharmacy*   Lab Work: None If you have labs (blood work) drawn today and your tests are completely normal, you will receive your results only by: Marland Kitchen MyChart Message (if you have MyChart) OR . A paper copy in the mail If you have any lab test that is abnormal or we need to change your treatment, we will call you to review the results.   Testing/Procedures: Your physician has requested that you have a TEE. During a TEE, sound waves are used to create images of your heart. It provides your doctor with information about the size and shape of your heart and how well your heart's chambers and valves are working. In this test, a transducer is attached to the end of a flexible tube that's guided down your throat and into your esophagus (the tube leading from you mouth to your stomach) to get a more detailed image of your heart. You are not awake for the procedure. Please see the instruction sheet given to you today. For further information please visit [email or web address]      Follow-Up: At [facility], you and your health needs are our priority.  As part of our continuing mission to provide you with exceptional heart care, we have created designated Provider Care Teams.  These Care Teams include your primary Cardiologist (physician) and Advanced Practice Providers (APPs -  Physician Assistants and Nurse Practitioners) who all work together to provide you with the care you need, when you need it.  We recommend signing up for the patient portal called "MyChart".  Sign up information is provided on this After Visit Summary.  MyChart is used to connect with patients for Virtual Visits (Telemedicine).  Patients are able to view  lab/test results, encounter notes, upcoming appointments, etc.  Non-urgent messages can be sent to your provider as well.   To learn more about what you can do with MyChart, go to [email or web address].    Your next appointment:   Will be based on results   Other Instructions  You are scheduled for a TEE/Cardioversion/TEE Cardioversion on June 11, 2020 with Dr. Eden Emms.  Please arrive at the [facility] (Main Entrance A) at [facility]: [address] [place], [place] [address] at 8:30 am/pm. (1 hour prior to procedure unless lab work is needed; if lab work is needed arrive 1.5 hours ahead)  DIET: Nothing to eat or drink after midnight except a sip of water with medications (see medication instructions below)   You must have a responsible person to drive you home and stay in the waiting area during your procedure. Failure to do so could result in cancellation.  Bring your insurance cards.  *Special Note: Every effort is made to have your procedure done on time. Occasionally there are emergencies that occur at the hospital that may cause delays. Please be patient if a delay does occur.

## 2020-06-06 ENCOUNTER — Ambulatory Visit
Admission: RE | Admit: 2020-06-06 | Discharge: 2020-06-06 | Disposition: A | Discharge status: Home / Self Care | Payer: Medicare Other | Source: Ambulatory Visit | Attending: Family Medicine | Admitting: Family Medicine

## 2020-06-06 ENCOUNTER — Other Ambulatory Visit: Payer: Self-pay | Admitting: Family Medicine

## 2020-06-06 DIAGNOSIS — M25552 Pain in left hip: Secondary | ICD-10-CM

## 2020-06-06 DIAGNOSIS — K828 Other specified diseases of gallbladder: Secondary | ICD-10-CM

## 2020-06-07 ENCOUNTER — Other Ambulatory Visit: Payer: Self-pay | Admitting: Family Medicine

## 2020-06-08 ENCOUNTER — Other Ambulatory Visit: Payer: Self-pay

## 2020-06-08 ENCOUNTER — Telehealth: Payer: Self-pay | Admitting: Cardiology

## 2020-06-08 ENCOUNTER — Ambulatory Visit (HOSPITAL_COMMUNITY)
Admission: RE | Admit: 2020-06-08 | Discharge: 2020-06-08 | Disposition: A | Discharge status: Home / Self Care | Payer: Medicare Other | Source: Ambulatory Visit | Attending: Cardiovascular Disease | Admitting: Cardiovascular Disease

## 2020-06-08 DIAGNOSIS — R42 Dizziness and giddiness: Secondary | ICD-10-CM | POA: Diagnosis not present

## 2020-06-08 NOTE — Telephone Encounter (Signed)
Patient is returning call in reference to vascular results. Connected to [employer]

## 2020-06-09 ENCOUNTER — Other Ambulatory Visit (HOSPITAL_COMMUNITY)
Admission: RE | Admit: 2020-06-09 | Discharge: 2020-06-09 | Disposition: A | Discharge status: Home / Self Care | Payer: Medicare Other | Source: Ambulatory Visit | Attending: Cardiovascular Disease | Admitting: Cardiovascular Disease

## 2020-06-09 DIAGNOSIS — Z20822 Contact with and (suspected) exposure to covid-19: Secondary | ICD-10-CM | POA: Diagnosis not present

## 2020-06-09 DIAGNOSIS — Z01812 Encounter for preprocedural laboratory examination: Secondary | ICD-10-CM | POA: Insufficient documentation

## 2020-06-09 LAB — SARS CORONAVIRUS 2 (TAT 6-24 HRS): SARS Coronavirus 2: NEGATIVE

## 2020-06-11 ENCOUNTER — Other Ambulatory Visit: Payer: Self-pay

## 2020-06-11 ENCOUNTER — Ambulatory Visit (HOSPITAL_COMMUNITY)
Admission: RE | Admit: 2020-06-11 | Discharge: 2020-06-11 | Disposition: A | Discharge status: Home / Self Care | Payer: Medicare Other | Attending: Cardiovascular Disease | Admitting: Cardiovascular Disease

## 2020-06-11 ENCOUNTER — Encounter (HOSPITAL_COMMUNITY)
Admission: RE | Disposition: A | Discharge status: Home / Self Care | Payer: Self-pay | Source: Home / Self Care | Attending: Cardiovascular Disease

## 2020-06-11 ENCOUNTER — Encounter (HOSPITAL_COMMUNITY): Payer: Self-pay | Admitting: Cardiovascular Disease

## 2020-06-11 ENCOUNTER — Ambulatory Visit (HOSPITAL_COMMUNITY): Payer: Medicare Other | Admitting: Certified Registered"

## 2020-06-11 ENCOUNTER — Ambulatory Visit (HOSPITAL_BASED_OUTPATIENT_CLINIC_OR_DEPARTMENT_OTHER)
Admission: RE | Admit: 2020-06-11 | Discharge: 2020-06-11 | Disposition: A | Discharge status: Home / Self Care | Payer: Medicare Other | Source: Ambulatory Visit | Attending: Physician Assistant | Admitting: Physician Assistant

## 2020-06-11 DIAGNOSIS — Z7951 Long term (current) use of inhaled steroids: Secondary | ICD-10-CM | POA: Insufficient documentation

## 2020-06-11 DIAGNOSIS — R0609 Other forms of dyspnea: Secondary | ICD-10-CM | POA: Insufficient documentation

## 2020-06-11 DIAGNOSIS — D860 Sarcoidosis of lung: Secondary | ICD-10-CM | POA: Diagnosis not present

## 2020-06-11 DIAGNOSIS — I081 Rheumatic disorders of both mitral and tricuspid valves: Secondary | ICD-10-CM | POA: Insufficient documentation

## 2020-06-11 DIAGNOSIS — Z882 Allergy status to sulfonamides status: Secondary | ICD-10-CM | POA: Insufficient documentation

## 2020-06-11 DIAGNOSIS — I340 Nonrheumatic mitral (valve) insufficiency: Secondary | ICD-10-CM

## 2020-06-11 DIAGNOSIS — E7801 Familial hypercholesterolemia: Secondary | ICD-10-CM | POA: Insufficient documentation

## 2020-06-11 DIAGNOSIS — K219 Gastro-esophageal reflux disease without esophagitis: Secondary | ICD-10-CM | POA: Insufficient documentation

## 2020-06-11 DIAGNOSIS — I341 Nonrheumatic mitral (valve) prolapse: Secondary | ICD-10-CM

## 2020-06-11 HISTORY — PX: TEE WITHOUT CARDIOVERSION: SHX5443

## 2020-06-11 LAB — ECHO TEE
MV M vel: 4.1 m/s
MV Peak grad: 67.2 mmHg
Radius: 0.65 cm

## 2020-06-11 SURGERY — ECHOCARDIOGRAM, TRANSESOPHAGEAL
Anesthesia: General

## 2020-06-11 MED ORDER — ONDANSETRON HCL 4 MG/2ML IJ SOLN
INTRAMUSCULAR | Status: DC | PRN
Start: 2020-06-11 — End: 2020-06-11
  Administered 2020-06-11: 4 mg via INTRAVENOUS

## 2020-06-11 MED ORDER — BUTAMBEN-TETRACAINE-BENZOCAINE 2-2-14 % EX AERO
INHALATION_SPRAY | CUTANEOUS | Status: DC | PRN
  Administered 2020-06-11: 2 via TOPICAL

## 2020-06-11 MED ORDER — PROPOFOL 10 MG/ML IV BOLUS
INTRAVENOUS | Status: DC | PRN
Start: 2020-06-11 — End: 2020-06-11
  Administered 2020-06-11: 75 ug/kg/min via INTRAVENOUS
  Administered 2020-06-11: 50 mg via INTRAVENOUS

## 2020-06-11 MED ORDER — SUCCINYLCHOLINE CHLORIDE 20 MG/ML IJ SOLN
INTRAMUSCULAR | Status: DC | PRN
  Administered 2020-06-11: 100 mg via INTRAVENOUS

## 2020-06-11 MED ORDER — LACTATED RINGERS IV SOLN
INTRAVENOUS | Status: DC
  Administered 2020-06-11: 1000 mL via INTRAVENOUS

## 2020-06-11 MED ORDER — LIDOCAINE HCL (CARDIAC) PF 100 MG/5ML IV SOSY
PREFILLED_SYRINGE | INTRAVENOUS | Status: DC | PRN
Start: 2020-06-11 — End: 2020-06-11
  Administered 2020-06-11: 20 mg via INTRAVENOUS
  Administered 2020-06-11: 40 mg via INTRAVENOUS

## 2020-06-11 MED ORDER — PHENYLEPHRINE HCL (PRESSORS) 10 MG/ML IV SOLN
INTRAVENOUS | Status: DC | PRN
Start: 2020-06-11 — End: 2020-06-11
  Administered 2020-06-11 (×2): 80 ug via INTRAVENOUS
  Administered 2020-06-11: 40 ug via INTRAVENOUS
  Administered 2020-06-11 (×2): 80 ug via INTRAVENOUS

## 2020-06-11 MED ORDER — SODIUM CHLORIDE 0.9 % IV SOLN: INTRAVENOUS | Status: DC

## 2020-06-11 NOTE — CV Procedure (Signed)
TEE: Anesthesia:  Patient intubated and anesthetized by Dr Arsenio Loader to sedat Will enough with muscle relaxant and propofol  EF 55-60% Moderate to severe MR No prolapse Carpentier 3A restricted posterior leaflet motion Normal AV Moderate TR Normal RV size and function  See full report in [employer] Extensive 3D imaging done   Charlton Haws MD [facility]

## 2020-06-11 NOTE — Anesthesia Postprocedure Evaluation (Signed)
Anesthesia Post Note  Patient: Tree surgeon  Procedure(s) Performed: TRANSESOPHAGEAL ECHOCARDIOGRAM (TEE) (N/A )     Patient location during evaluation: Endoscopy Anesthesia Type: General Level of consciousness: awake and alert Pain management: pain level controlled Vital Signs Assessment: post-procedure vital signs reviewed and stable Respiratory status: spontaneous breathing, nonlabored ventilation and respiratory function stable Cardiovascular status: blood pressure returned to baseline and stable Postop Assessment: no apparent nausea or vomiting Anesthetic complications: no   No complications documented.  Last Vitals:  Vitals:   06/11/20 1048 06/11/20 1100  BP: (!) 145/68 (!) 145/68  Pulse: 90   Resp: 13   Temp:    SpO2: 99%     Last Pain:  Vitals:   06/11/20 1100  TempSrc:   PainSc: 3                  Chenell Lozon,W. EDMOND

## 2020-06-11 NOTE — Discharge Instructions (Signed)

## 2020-06-11 NOTE — Anesthesia Procedure Notes (Signed)
Procedure Name: Intubation Date/Time: 06/11/2020 9:56 AM Performed by: Lavell Luster, CRNA Pre-anesthesia Checklist: Patient identified, Emergency Drugs available, Suction available, Patient being monitored and Timeout performed Patient Re-evaluated:Patient Re-evaluated prior to induction Oxygen Delivery Method: Circle system utilized Preoxygenation: Pre-oxygenation with 100% oxygen Induction Type: IV induction Ventilation: Mask ventilation without difficulty Laryngoscope Size: Mac, 4 and Glidescope Grade View: Grade I Tube type: Oral Tube size: 7.5 mm Number of attempts: 1 Placement Confirmation: ETT inserted through vocal cords under direct vision,  positive ETCO2 and breath sounds checked- equal and bilateral Secured at: 22 cm Tube secured with: Tape Dental Injury: Teeth and Oropharynx as per pre-operative assessment  Future Recommendations: Recommend- induction with short-acting agent, and alternative techniques readily available

## 2020-06-11 NOTE — Interval H&P Note (Signed)
History and Physical Interval Note:  06/11/2020 9:12 AM  Diana Calhoun  has presented today for surgery, with the diagnosis of SEVERE MITRAL REGURGITATION.  The various methods of treatment have been discussed with the patient and family. After consideration of risks, benefits and other options for treatment, the patient has consented to  Procedure(s): TRANSESOPHAGEAL ECHOCARDIOGRAM (TEE) (N/A) as a surgical intervention.  The patient's history has been reviewed, patient examined, no change in status, stable for surgery.  I have reviewed the patient's chart and labs.  Questions were answered to the patient's satisfaction.     Charlton Haws

## 2020-06-11 NOTE — Progress Notes (Signed)
°  Echocardiogram Echocardiogram Transesophageal has been performed.  Burnard Hawthorne 06/11/2020, 10:39 AM

## 2020-06-11 NOTE — Transfer of Care (Signed)
Immediate Anesthesia Transfer of Care Note  Patient: Diana Calhoun  Procedure(s) Performed: TRANSESOPHAGEAL ECHOCARDIOGRAM (TEE) (N/A )  Patient Location: Endoscopy Unit  Anesthesia Type:General  Level of Consciousness: awake, alert  and sedated  Airway & Oxygen Therapy: Patient connected to nasal cannula oxygen  Post-op Assessment: Post -op Vital signs reviewed and stable  Post vital signs: stable  Last Vitals:  Vitals Value Taken Time  BP 141/60 06/11/20 1039  Temp 36.5 C 06/11/20 1039  Pulse 89 06/11/20 1043  Resp 14 06/11/20 1043  SpO2 100 % 06/11/20 1043  Vitals shown include unvalidated device data.  Last Pain:  Vitals:   06/11/20 1039  TempSrc: Oral  PainSc:          Complications: No complications documented.

## 2020-06-11 NOTE — Anesthesia Preprocedure Evaluation (Addendum)
Anesthesia Evaluation  Patient identified by MRN, date of birth, ID band Patient awake    Reviewed: Allergy & Precautions, H&P , NPO status , Patient's Chart, lab work & pertinent test results  Airway Mallampati: II  TM Distance: >3 FB Neck ROM: Full    Dental no notable dental hx. (+) Teeth Intact, Dental Advisory Given,    Pulmonary neg pulmonary ROS,    Pulmonary exam normal breath sounds clear to auscultation       Cardiovascular + Valvular Problems/Murmurs MR  Rhythm:Regular Rate:Normal     Neuro/Psych negative neurological ROS  negative psych ROS   GI/Hepatic Neg liver ROS, GERD  ,  Endo/Other  Morbid obesity  Renal/GU negative Renal ROS  negative genitourinary   Musculoskeletal   Abdominal   Peds  Hematology negative hematology ROS (+)   Anesthesia Other Findings   Reproductive/Obstetrics negative OB ROS                           Anesthesia Physical Anesthesia Plan  ASA: III  Anesthesia Plan: MAC   Post-op Pain Management:    Induction: Intravenous  PONV Risk Score and Plan: 2 and Propofol infusion and Treatment may vary due to age or medical condition  Airway Management Planned: Nasal Cannula  Additional Equipment:   Intra-op Plan:   Post-operative Plan:   Informed Consent: I have reviewed the patients History and Physical, chart, labs and discussed the procedure including the risks, benefits and alternatives for the proposed anesthesia with the patient or authorized representative who has indicated his/her understanding and acceptance.     Dental advisory given  Plan Discussed with: CRNA  Anesthesia Plan Comments:         Anesthesia Quick Evaluation

## 2020-06-12 ENCOUNTER — Encounter (HOSPITAL_COMMUNITY): Payer: Self-pay | Admitting: Cardiovascular Disease

## 2020-06-27 ENCOUNTER — Institutional Professional Consult (permissible substitution): Payer: Medicare Other | Admitting: Emergency Medicine

## 2020-07-03 ENCOUNTER — Encounter: Payer: Self-pay | Admitting: Gastroenterology

## 2020-07-19 ENCOUNTER — Encounter: Payer: Self-pay | Admitting: Orthopaedic Surgery

## 2020-07-19 ENCOUNTER — Ambulatory Visit: Payer: Medicare Other | Admitting: Orthopaedic Surgery

## 2020-07-19 ENCOUNTER — Ambulatory Visit: Payer: Self-pay

## 2020-07-19 DIAGNOSIS — M545 Low back pain, unspecified: Secondary | ICD-10-CM

## 2020-07-19 MED ORDER — PREDNISONE 10 MG (21) PO TBPK: ORAL_TABLET | ORAL | 0 refills | Status: DC
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
## 2020-07-31 ENCOUNTER — Encounter: Payer: Self-pay | Admitting: Cardiology

## 2020-08-17 ENCOUNTER — Ambulatory Visit: Payer: Medicare Other

## 2020-08-17 ENCOUNTER — Other Ambulatory Visit: Payer: Medicare Other

## 2020-08-21 ENCOUNTER — Telehealth: Payer: Self-pay | Admitting: Cardiology

## 2020-08-21 ENCOUNTER — Telehealth: Payer: Medicare Other | Admitting: Physician Assistant

## 2020-08-21 NOTE — Telephone Encounter (Signed)
Pt c/o swelling: STAT is pt has developed SOB within 24 hours  1) How much weight have you gained and in what time span? No noticeable weight gain  2) If swelling, where is the swelling located? Both ankles  3) Are you currently taking a fluid pill? Yes   4) Are you currently SOB? No   5) Do you have a log of your daily weights (if so, list)? No log available   6) Have you gained 3 pounds in a day or 5 pounds in a week? No   7) Have you traveled recently? No

## 2020-08-21 NOTE — Telephone Encounter (Signed)
I would recommend to continue taking lasix daily

## 2020-08-21 NOTE — Telephone Encounter (Signed)
Pt is calling in with complaints of bilateral lower extremity swelling and inconsistent sob at that times.  Pt states she saw her PCP for this about a week ago, and she started her on low dose lasix 20 mg po daily, potassium supplement, and referred her to Endocrinology for abnormal TSH levels, new onset.  Pt states she will see Endocrinology in Dec, and will need to call her PCP back about this, for she will need medication called into her pharmacy for this, for she is inconsistently having hot flashes and palpitations at times.  Pt states she has not been weighing herself daily, but when she saw her PCP, she informed the pt that her weight was stable, and no increase from her baseline.  Pt has an appt scheduled with Dr. Delton See for this month on 10/25.  Asked the pt if she is taking her lasix 20 mg po daily, as advised by her PCP, and pt states she has been missing doses of this.  Advised the pt that her swelling will come back, as well as her SOB, if she does not take this med as prescribed.  Advised her to take her lasix and K as prescribed, limit her salt intake, wear stockings during the day, elevate her extremities at rest, hydrate with water, and dry weigh herself and log this.  Weight gain parameters reviewed with the pt.  ED precautions provided to the pt, if symptoms were to persist or worsen.  Advised her to touch base with her PCP tomorrow and inform her that the Endocrinology referral she advised on, is not until Dec, and she will need a maintenance medication for her abnormal TSH levels, until that appt. Informed the pt that we will see her as planned for 10/25.  Informed her that I will route this message to Dr. Delton See as a general FYI, and to advise with any further recommendations as needed.  Pt verbalized understanding and agrees with this plan. Pt felt reassured after conversation, and appreciative for all the assistance provided.

## 2020-08-22 NOTE — Telephone Encounter (Signed)
Pt previously aware to continue taking lasix daily.

## 2020-08-29 ENCOUNTER — Ambulatory Visit: Payer: Medicare Other | Admitting: Gastroenterology

## 2020-09-06 ENCOUNTER — Encounter: Payer: Self-pay | Admitting: Emergency Medicine

## 2020-09-06 ENCOUNTER — Telehealth: Payer: Self-pay | Admitting: Cardiology

## 2020-09-06 ENCOUNTER — Ambulatory Visit: Payer: Medicare Other | Admitting: Emergency Medicine

## 2020-09-06 ENCOUNTER — Other Ambulatory Visit: Payer: Self-pay

## 2020-09-06 DIAGNOSIS — D869 Sarcoidosis, unspecified: Secondary | ICD-10-CM | POA: Insufficient documentation

## 2020-09-06 MED ORDER — BREO ELLIPTA 200-25 MCG/INH IN AEPB: 1.0000 | INHALATION_SPRAY | Freq: Every day | RESPIRATORY_TRACT | 0 refills | Status: DC

## 2020-09-06 NOTE — Assessment & Plan Note (Signed)
With apparent associated obstructive lung disease based on her clinical response to [facility]  I do not have any of her old imaging or PFT available but for now I think we should stay on the [facility].  She can use albuterol as needed.  I will perform baseline PFT, baseline CT chest, try to obtain any of her old records if I can.  She does have a rash in her mid upper back but this is pale, has been pruritic, not convinced that this is sarcoid.  For now I would hold off on any systemic treatment.  Restart your Breo 1 inhalation once daily.  Rinse and gargle after using. You can keep your albuterol available to use 2 puffs if needed for shortness of breath, chest tightness, wheezing. We will arrange for a CT scan of your chest with contrast to evaluate for any evidence for sarcoidosis We will arrange for pulmonary function testing in next office visit Please arrange to follow-up with ophthalmology since sarcoidosis can sometimes affect the eyes Get your Covid booster shot as planned Get the flu shot this fall We can talk about the timing of getting the pneumonia shot at your next visit Follow with Dr. Delton Coombes next available with full pulmonary function testing on the same day.

## 2020-09-06 NOTE — Addendum Note (Signed)
Addended by: Dorisann Frames R on: 09/06/2020 03:31 PM   Modules accepted: Orders

## 2020-09-06 NOTE — Progress Notes (Signed)
Subjective:    Patient ID: Diana Calhoun, female    DOB: 1956-01-05, 64 y.o.   MRN: [id number]  HPI 63 year old woman with history of obesity, hyperlipidemia, GERD, mitral regurgitation.  She carries a diagnosis of pulmonary and cardiac sarcoidosis that was made by mediastinoscopy in [place] after she was found to have LAD and infiltrates ~28 yrs ago. She has had rash, particularly on her back.  She had a sarcoid pericardial effusion that required window back in [place]. Has been on prolonged steroids before but not lately. No tapers in 2-3 years.  She is feeling bad - she has noticed sluggishness, hot / flushed, some poor sleep, increased LE edema. Her PCP is evaluating thyroid disease. She has some visual changes over about 2 months. Her breathing has been stable. Minimal cough. The rash is on her back, itches.  She was formally on Breo, ran out last week. Misses it when off.  Uses albuterol about 2x a day since off Breo.  She saw optho about 9 months ago here locally.   Echocardiogram 05/31/2020 shows intact LVEF, grade 2 diastolic dysfunction, mitral valve posterior leaflet calcification and prolapse with severe MR, normal right ventricular size and function and normal PASP.  TEE 06/11/20 confirmed intact RV function and normal pulmonary pressures, reassuring mitral valve function  She is COVID vaccinated. Needs the flu shot this fall. She has never had PNA shot.    Review of Systems As per HPI  Past Medical History:  Diagnosis Date  . Body mass index (BMI) of 30.0-30.9 in adult   . Chest discomfort   . Familial hypercholesterolemia   . GERD (gastroesophageal reflux disease)   . Mitral valve regurgitation   . Pulmonary sarcoidosis (HCC)      Family History  Problem Relation Age of Onset  . Hypertension Mother   . Diabetes Sister   . Diabetes Sister      Social History   Socioeconomic History  . Marital status: Single    Spouse name: Not on file  . Number of children: Not on file    . Years of education: Not on file  . Highest education level: Not on file  Occupational History  . Not on file  Tobacco Use  . Smoking status: Never Smoker  . Smokeless tobacco: Never Used  Substance and Sexual Activity  . Alcohol use: Not on file  . Drug use: Not on file  . Sexual activity: Not on file  Other Topics Concern  . Not on file  Social History Narrative  . Not on file   Social Determinants of Health   Financial Resource Strain:   . Difficulty of Paying Living Expenses: Not on file  Food Insecurity:   . Worried About Programme researcher, broadcasting/film/video in the Last Year: Not on file  . Ran Out of Food in the Last Year: Not on file  Transportation Needs:   . Lack of Transportation (Medical): Not on file  . Lack of Transportation (Non-Medical): Not on file  Physical Activity:   . Days of Exercise per Week: Not on file  . Minutes of Exercise per Session: Not on file  Stress:   . Feeling of Stress : Not on file  Social Connections:   . Frequency of Communication with Friends and Family: Not on file  . Frequency of Social Gatherings with Friends and Family: Not on file  . Attends Religious Services: Not on file  . Active Member of Clubs or Organizations: Not on file  .  Attends Banker Meetings: Not on file  . Marital Status: Not on file  Intimate Partner Violence:   . Fear of Current or Ex-Partner: Not on file  . Emotionally Abused: Not on file  . Physically Abused: Not on file  . 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  Has lived in [place], [place], [place]    Allergies  Allergen Reactions  . Shellfish Allergy Itching  . Sulfa Antibiotics Other (See Comments)    Unknown     Outpatient Medications Prior to Visit  Medication Sig Dispense Refill  . acetaminophen (TYLENOL) 650 MG CR tablet Take 650 mg by mouth every 8 (eight) hours as needed for pain (Hip pain).    . montelukast (SINGULAIR) 10 MG tablet Take 10 mg by mouth at  bedtime as needed (allergies).     . Red Yeast Rice 600 MG TABS Take 1 tablet (600 mg total) by mouth daily. 100 tablet 12  . fluticasone furoate-vilanterol (BREO ELLIPTA) 100-25 MCG/INH AEPB Place 1 puff into the nose in the morning and at bedtime. (Patient not taking: Reported on 09/06/2020)    . predniSONE (STERAPRED UNI-PAK 21 TAB) 10 MG (21) TBPK tablet Take as directed (Patient not taking: Reported on 09/06/2020) 21 tablet 0   No facility-administered medications prior to visit.        Objective:   Physical Exam  Vitals:   09/06/20 1125  BP: 118/70  Pulse: 94  Temp: 98 F (36.7 C)  TempSrc: Temporal  SpO2: 97%  Weight: 230 lb 6.4 oz (104.5 kg)  Height: 5\' 5"  (1.651 m)   Gen: Pleasant, overwt woman, in no distress,  normal affect  ENT: No lesions,  mouth clear,  oropharynx clear, no postnasal drip  Neck: No JVD, no stridor  Lungs: No use of accessory muscles, no crackles or wheezing on normal respiration, no wheeze on forced expiration  Cardiovascular: RRR, heart sounds normal, no murmur or gallops, no peripheral edema  Musculoskeletal: No deformities, no cyanosis or clubbing  Neuro: alert, awake, non focal  Skin: Warm, healing raised non-pigmented rash mid upper back, some scabs from scratching.      Assessment & Plan:  Sarcoidosis With apparent associated obstructive lung disease based on her clinical response to [facility].  I do not have any of her old imaging or PFT available but for now I think we should stay on the [facility].  She can use albuterol as needed.  I will perform baseline PFT, baseline CT chest, try to obtain any of her old records if I can.  She does have a rash in her mid upper back but this is pale, has been pruritic, not convinced that this is sarcoid.  For now I would hold off on any systemic treatment.  Restart your Breo 1 inhalation once daily.  Rinse and gargle after using. You can keep your albuterol available to use 2 puffs if needed for shortness  of breath, chest tightness, wheezing. We will arrange for a CT scan of your chest with contrast to evaluate for any evidence for sarcoidosis We will arrange for pulmonary function testing in next office visit Please arrange to follow-up with ophthalmology since sarcoidosis can sometimes affect the eyes Get your Covid booster shot as planned Get the flu shot this fall We can talk about the timing of getting the pneumonia shot at your next visit Follow with Dr. [facility] next available with full pulmonary  function testing on the same day.   Levy Pupa, MD, PhD 09/06/2020, 12:01 PM [facility] [phone] or if no answer [phone]

## 2020-09-06 NOTE — Patient Instructions (Signed)
Restart your Breo 1 inhalation once daily.  Rinse and gargle after using. You can keep your albuterol available to use 2 puffs if needed for shortness of breath, chest tightness, wheezing. We will arrange for a CT scan of your chest with contrast to evaluate for any evidence for sarcoidosis We will arrange for pulmonary function testing in next office visit Please arrange to follow-up with ophthalmology since sarcoidosis can sometimes affect the eyes Get your Covid booster shot as planned Get the flu shot this fall We can talk about the timing of getting the pneumonia shot at your next visit Follow with Dr. Delton Coombes next available with full pulmonary function testing on the same day.

## 2020-09-06 NOTE — Addendum Note (Signed)
Addended by: Dorisann Frames R on: 09/06/2020 12:07 PM   Modules accepted: Orders

## 2020-09-10 ENCOUNTER — Encounter: Payer: Self-pay | Admitting: Cardiology

## 2020-09-10 ENCOUNTER — Ambulatory Visit: Payer: Medicare Other | Admitting: Cardiology

## 2020-09-10 ENCOUNTER — Other Ambulatory Visit: Payer: Self-pay

## 2020-09-10 ENCOUNTER — Ambulatory Visit: Payer: Self-pay | Admitting: Internal Medicine

## 2020-09-10 VITALS — BP 132/70 | HR 97 | Ht 65.0 in | Wt 231.6 lb

## 2020-09-10 DIAGNOSIS — E0590 Thyrotoxicosis, unspecified without thyrotoxic crisis or storm: Secondary | ICD-10-CM

## 2020-09-10 DIAGNOSIS — D869 Sarcoidosis, unspecified: Secondary | ICD-10-CM

## 2020-09-10 DIAGNOSIS — I340 Nonrheumatic mitral (valve) insufficiency: Secondary | ICD-10-CM | POA: Diagnosis not present

## 2020-09-10 DIAGNOSIS — R42 Dizziness and giddiness: Secondary | ICD-10-CM

## 2020-09-10 DIAGNOSIS — I341 Nonrheumatic mitral (valve) prolapse: Secondary | ICD-10-CM

## 2020-09-10 DIAGNOSIS — I10 Essential (primary) hypertension: Secondary | ICD-10-CM

## 2020-09-10 MED ORDER — FUROSEMIDE 40 MG PO TABS: 40.0000 mg | ORAL_TABLET | Freq: Every day | ORAL | 11 refills | Status: DC

## 2020-09-10 NOTE — Patient Instructions (Addendum)
Medication Instructions:  1) TAKE Furosemide 40mg  once daily for 7 days and then only take it as needed for swelling.   *If you need a refill on your cardiac medications before your next appointment, please call your pharmacy*   Lab Work: BMET, Pro BNP, TSH, Free T4, Free T3 today  If you have labs (blood work) drawn today and your tests are completely normal, you will receive your results only by: MyChart Message (if you have MyChart) OR . A paper copy in the mail If you have any lab test that is abnormal or we need to change your treatment, we will call you to review the results.   Testing/Procedures: Your physician has requested that you have an echocardiogram in December. Echocardiography is a painless test that uses sound waves to create images of your heart. It provides your doctor with information about the size and shape of your heart and how well your heart's chambers and valves are working. This procedure takes approximately one hour. There are no restrictions for this procedure.     Follow-Up: At [facility], you and your health needs are our priority.  As part of our continuing mission to provide you with exceptional heart care, we have created designated Provider Care Teams.  These Care Teams include your primary Cardiologist (physician) and Advanced Practice Providers (APPs -  Physician Assistants and Nurse Practitioners) who all work together to provide you with the care you need, when you need it.  We recommend signing up for the patient portal called "MyChart".  Sign up information is provided on this After Visit Summary.  MyChart is used to connect with patients for Virtual Visits (Telemedicine).  Patients are able to view lab/test results, encounter notes, upcoming appointments, etc.  Non-urgent messages can be sent to your provider as well.   To learn more about what you can do with MyChart, go to [facility].    Your next appointment:   3  month(s)  The format for your next appointment:   In Person  Provider:   You may see [email or web address], MD or one of the following Advanced Practice Providers on your designated Care Team:    Tobias Alexander, PA-C  Ronie Spies, PA-C    Other Instructions

## 2020-09-10 NOTE — Progress Notes (Deleted)
Name: Diana Calhoun  MRN/ DOB: [id number], 1956/08/19    Age/ Sex: 64 y.o., female    PCP: Margot Ables, MD   Reason for Endocrinology Evaluation: Hyperthyroidism     Date of Initial Endocrinology Evaluation: 09/10/2020     HPI: Ms. Diana Calhoun is a 64 y.o. female with a past medical history of Sarcoidosis and Asthma . The patient presented for initial endocrinology clinic visit on 09/10/2020 for consultative assistance with her Hyperthyroidism.   Pt presented to her PCP on 08/14/2020 with c/o sob, anxiety and LE edema. She had recently had an Echo showing a moderately enlarged left atrium,moderate tricuspid regurgitation, rest is normal with an EF 60-65%. Her TSh came back suppressed at < 0.01 uIU/mL with elevated FT4 2.1 ng/dL ( reference 7.8-6.7 )     HISTORY:  Past Medical History:  Past Medical History:  Diagnosis Date  . Body mass index (BMI) of 30.0-30.9 in adult   . Chest discomfort   . Familial hypercholesterolemia   . GERD (gastroesophageal reflux disease)   . Mitral valve regurgitation   . Pulmonary sarcoidosis [facility])     Past Surgical History:  Past Surgical History:  Procedure Laterality Date  . CESAREAN SECTION    . PERCARDIUM INFUSION    . TEE WITHOUT CARDIOVERSION N/A 06/11/2020   Procedure: TRANSESOPHAGEAL ECHOCARDIOGRAM (TEE);  Surgeon: Wendall Stade, MD;  Location: [facility] ENDOSCOPY;  Service: Cardiovascular;  Laterality: N/A;      Social History:  reports that she has never smoked. She has never used smokeless tobacco.  Family History: family history includes Diabetes in her sister and sister; Hypertension in her mother.   HOME MEDICATIONS: Allergies as of 09/10/2020      Reactions   Shellfish Allergy Itching   Sulfa Antibiotics Other (See Comments)   Unknown      Medication List       Accurate as of September 10, 2020  7:18 AM. If you have any questions, ask your nurse or doctor.        acetaminophen 650 MG CR tablet Commonly  known as: TYLENOL Take 650 mg by mouth every 8 (eight) hours as needed for pain (Hip pain).   Breo Ellipta 100-25 MCG/INH Aepb Generic drug: fluticasone furoate-vilanterol Place 1 puff into the nose in the morning and at bedtime.   Breo Ellipta 200-25 MCG/INH Aepb Generic drug: fluticasone furoate-vilanterol Inhale 1 puff into the lungs daily.   montelukast 10 MG tablet Commonly known as: SINGULAIR Take 10 mg by mouth at bedtime as needed (allergies).   predniSONE 10 MG (21) Tbpk tablet Commonly known as: STERAPRED UNI-PAK 21 TAB Take as directed   Red Yeast Rice 600 MG Tabs Take 1 tablet (600 mg total) by mouth daily.         REVIEW OF SYSTEMS: A comprehensive ROS was conducted with the patient and is negative except as per HPI and below:  ROS     OBJECTIVE:  VS: There were no vitals taken for this visit.   Wt Readings from Last 3 Encounters:  09/06/20 230 lb 6.4 oz (104.5 kg)  06/05/20 237 lb (107.5 kg)  05/09/20 238 lb 6.4 oz (108.1 kg)     EXAM: General: Pt appears well and is in NAD  Neck: General: Supple without adenopathy. Thyroid: Thyroid size normal.  No goiter or nodules appreciated. No thyroid bruit.  Lungs: Clear with good BS bilat with no rales, rhonchi, or wheezes  Heart: Auscultation: RRR.  Abdomen: Normoactive bowel  sounds, soft, nontender, without masses or organomegaly palpable  Extremities:  BL LE: No pretibial edema normal ROM and strength.  Skin: Hair: Texture and amount normal with gender appropriate distribution Skin Inspection: No rashes Skin Palpation: Skin temperature, texture, and thickness normal to palpation  Neuro: Cranial nerves: II - XII grossly intact  Motor: Normal strength throughout DTRs: 2+ and symmetric in UE without delay in relaxation phase  Mental Status: Judgment, insight: Intact Orientation: Oriented to time, place, and person Mood and affect: No depression, anxiety, or agitation     DATA REVIEWED: ***     ASSESSMENT/PLAN/RECOMMENDATIONS:   1. Hyperthyroidism:    Medications :  Signed electronically by: Lyndle Herrlich, MD  [facility] Endocrinology  [facility] [address] [place], [place] [address] Phone: [phone] FAX: [phone]   CC: Margot Ables, MD [address] [place] [address] Phone: [phone] Fax: [phone]   Return to Endocrinology clinic as below: Future Appointments  Date Time Provider Department Center  09/10/2020  9:00 AM Lars Masson, MD [facility]  09/10/2020 11:30 AM Inis Borneman, Konrad Dolores, MD [facility] None  09/12/2020  3:00 PM [facility] 1 [facility]  10/30/2020 12:00 PM [facility] PFT ROOM [facility] None  10/30/2020  1:45 PM Byrum, Les Pou, MD [facility] None

## 2020-09-10 NOTE — Progress Notes (Signed)
Cardiology Office Note:    Date:  09/10/2020   ID:  Diana Calhoun, DOB 05-Jun-1956, MRN [id number]  PCP:  Margot Ables, MD  [facility] Cardiologist:  Tobias Alexander, MD  [facility] Electrophysiologist:  None   Referring MD: Margot Ables*   In for visit follow-up for mitral regurgitation, dyspnea on exertion.  History of Present Illness:    Diana Calhoun is a 64 y.o. female with a hx of sarcoidosis, mitral valve prolapse, GERD, who recently moved here from [facility].  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  She has been followed by cardiology is in [facility] until she moved here.  Most recently she underwent calcium scoring in January of this year that showed calcium score of 0.  She was previously followed by pulmonologist for pulmonary sarcoidosis currently on no medication but previously on oral prednisone.  She underwent echocardiography in 2019 that showed LVEF 55%, no wall motion abnormalities, mildly dilated left atrium, mild to moderate mitral regurgitation, mild tricuspid regurgitation with mildly elevated right-sided pressures with RVSP of 41 mmHg.  She states that she is experiencing symptoms of dyspnea on exertion however she attributes it to weight gain since her retirement 5 months ago.    The patient is coming after 6 months, she underwent TTE and TEE in July 2021, she has also been seen by Dr. Delton Coombes who started her on Breo inhaler that improved her shortness of breath, she is also scheduled for chest CT as well as pulmonary function test based on which Dr. Delton Coombes will decide about potential steroid therapy for lung sarcoidosis.  She has also been diagnosed with hyperthyroidism and is supposed to see endocrinologist.  She states that her lower extremity edema is slightly worse but she has no orthopnea or proximal nocturnal dyspnea.  No chest pain.  Past  Medical History:  Diagnosis Date  . Body mass index (BMI) of 30.0-30.9 in adult   . Chest discomfort   . Familial hypercholesterolemia   . GERD (gastroesophageal reflux disease)   . Mitral valve regurgitation   . Pulmonary sarcoidosis (HCC)    Current Medications: Current Meds  Medication Sig  . acetaminophen (TYLENOL) 650 MG CR tablet Take 650 mg by mouth every 8 (eight) hours as needed for pain (Hip pain).  Marland Kitchen alendronate (FOSAMAX) 70 MG tablet Take 70 mg by mouth once a week.  . fluticasone furoate-vilanterol (BREO ELLIPTA) 100-25 MCG/INH AEPB Place 1 puff into the nose in the morning and at bedtime.   . fluticasone furoate-vilanterol (BREO ELLIPTA) 200-25 MCG/INH AEPB Inhale 1 puff into the lungs daily.  . montelukast (SINGULAIR) 10 MG tablet Take 10 mg by mouth at bedtime as needed (allergies).   . pantoprazole (PROTONIX) 20 MG tablet Take 20 mg by mouth as needed.  Marland Kitchen POTASSIUM PO Take by mouth as needed (when taking lasix).  . traZODone (DESYREL) 50 MG tablet Take 50 mg by mouth at bedtime as needed for sleep.  . [DISCONTINUED] furosemide (LASIX) 20 MG tablet Take 20 mg by mouth as needed.    Allergies:   Shellfish allergy and Sulfa antibiotics   Social History   Socioeconomic History  . Marital status: Single    Spouse name: Not on file  . Number of children: Not on file  . Years of education: Not on file  . Highest education level: Not on file  Occupational History  . Not on file  Tobacco Use  .  Smoking status: Never Smoker  . Smokeless tobacco: Never Used  Substance and Sexual Activity  . Alcohol use: Not on file  . Drug use: Not on file  . Sexual activity: Not on file  Other Topics Concern  . Not on file  Social History Narrative  . Not on file   Social Determinants of Health   Financial Resource Strain:   . Difficulty of Paying Living Expenses: Not on file  Food Insecurity:   . Worried About Programme researcher, broadcasting/film/videounning Out of Food in the Last Year: Not on file  . Ran Out of  Food in the Last Year: Not on file  Transportation Needs:   . Lack of Transportation (Medical): Not on file  . Lack of Transportation (Non-Medical): Not on file  Physical Activity:   . Days of Exercise per Week: Not on file  . Minutes of Exercise per Session: Not on file  Stress:   . Feeling of Stress : Not on file  Social Connections:   . Frequency of Communication with Friends and Family: Not on file  . Frequency of Social Gatherings with Friends and Family: Not on file  . Attends Religious Services: Not on file  . Active Member of Clubs or Organizations: Not on file  . Attends BankerClub or Organization Meetings: Not on file  . Marital Status: Not on file     Family History: The patient's family history includes Diabetes in her sister and sister; Hypertension in her mother.  ROS:   Please see the history of present illness.    All other systems reviewed and are negative.  EKGs/Labs/Other Studies Reviewed:    The following studies were reviewed today:  EKG:  EKG is ordered today.  The ekg ordered today demonstrates normal sinus rhythm, normal EKG, 95 bpm, unchanged from prior. This was personally reviewed.  Recent Labs: 05/31/2020: ALT 23; BUN 16; Creatinine, Ser 0.88; Hemoglobin 11.7; Platelets 157; Potassium 4.0; Sodium 140; TSH 0.603  Recent Lipid Panel    Component Value Date/Time   CHOL 181 05/31/2020 0818   TRIG 63 05/31/2020 0818   HDL 55 05/31/2020 0818   CHOLHDL 3.3 05/31/2020 0818   LDLCALC 114 (H) 05/31/2020 0818    Physical Exam:    VS:  BP 132/70   Pulse 97   Ht 5\' 5"  (1.651 m)   Wt 231 lb 9.6 oz (105.1 kg)   SpO2 95%   BMI 38.54 kg/m     Wt Readings from Last 3 Encounters:  09/10/20 231 lb 9.6 oz (105.1 kg)  09/06/20 230 lb 6.4 oz (104.5 kg)  06/05/20 237 lb (107.5 kg)    GEN:  Well nourished, well developed in no acute distress HEENT: Normal NECK: No JVD; No carotid bruits LYMPHATICS: No lymphadenopathy CARDIAC: RRR, 3/6 systolic murmurs, rubs,  gallops RESPIRATORY:  Clear to auscultation without rales, wheezing or rhonchi  ABDOMEN: Soft, non-tender, non-distended MUSCULOSKELETAL:  No edema; No deformity  SKIN: Warm and dry NEUROLOGIC:  Alert and oriented x 3 PSYCHIATRIC:  Normal affect   TTE: 05/31/2020  1. Left ventricular ejection fraction, by estimation, is 60 to 65%. The  left ventricle has normal function. The left ventricle has no regional  wall motion abnormalities. Left ventricular diastolic parameters are  consistent with Grade II diastolic  dysfunction (pseudonormalization). Elevated left ventricular end-diastolic  pressure.  2. Right ventricular systolic function is normal. The right ventricular  size is normal. There is normal pulmonary artery systolic pressure. The  estimated right ventricular systolic pressure  is 33.2 mmHg.  3. Moderate posterior leaflet calcification of the mitral valve  leaflet(s). Late systolic prolapse of the AMVL with posteriorly directed  MR that hugs the atrial wall. Recommend further evaluation with TEE for  suspected severe MR.  4. The mitral valve is abnormal. Moderate to probaby severe mitral valve  regurgitation.  5. The aortic valve is tricuspid. Aortic valve regurgitation is not  visualized.  6. Left atrial size was moderately dilated.  7. The inferior vena cava is normal in size with <50% respiratory  variability, suggesting right atrial pressure of 8 mmHg.   TEE: 06/11/2020  1. Left ventricular ejection fraction, by estimation, is 60 to 65%. The  left ventricle has normal function. The left ventricle has no regional  wall motion abnormalities.  2. Right ventricular systolic function is normal. The right ventricular  size is normal. There is normal pulmonary artery systolic pressure.  3. Left atrial size was moderately dilated. No left atrial/left atrial  appendage thrombus was detected.  4. No prolapse. Carpentier type 3A restricted posterior leaflet motion   Pisa Radius 0.8 , ERO .39 and RV 55 suggest moderate to severe disease. 3D  VC 0.87 suggests worse but there appears to be two MR jets one central and  one lateral which makes  assessing 3D VC difficult There was blunted systolic forward flow in the  PV;s but clearly no reversal . The mitral valve is abnormal. Moderate to  severe mitral valve regurgitation. No evidence of mitral stenosis.  5. Tricuspid valve regurgitation is moderate.  6. The aortic valve is tricuspid. Aortic valve regurgitation is not  visualized. No aortic stenosis is present.  7. The inferior vena cava is normal in size with greater than 50%  respiratory variability, suggesting right atrial pressure of 3 mmHg.   ASSESSMENT:    1. Mitral valve insufficiency, unspecified etiology   2. MVP (mitral valve prolapse)   3. Dizziness   4. Essential hypertension   5. Sarcoidosis   6. Hyperthyroidism   7. Severe mitral regurgitation      PLAN:    In order of problems listed above:  1. Moderate to severe mitral regurgitation, I have personally reviewed both her transthoracic and transesophageal echocardiogram from July 2021, I believe that her mitral regurgitation is severe, she also has mild to moderate pulmonary hypertension, there is reversal of forward flow in pulmonary veins.  Her symptoms of shortness of breath has recently improved with pulmonary inhalers, she wants to be treated for pulmonary sarcoidosis first and see how her symptoms are, I will see her back in 3 months with echo prior to that appointment to reevaluate her mitral regurgitation but I estimate that she will require mitral valve repair via minithoracotomy in the next year. 2. Pulmonary sarcoidosis, no evidence of cardiac sarcoidosis, she has no palpitations or evidence for ventricular tachycardia's, she is being followed by Dr. Delton Coombes, scheduled for PFTs, chest CTA and potential steroid therapy. 3. Hyperthyroidism, normal TSH earlier this year, will  repeat today TSH FT3 and FT4. 4. Lower extremity edema -possibly secondary to severe MR, I will increase Lasix to 40 mg daily for 7 days and after that as needed. 5. Family history of brain aneurysm -with some neck pain, will obtain carotid ultrasound.   Medication Adjustments/Labs and Tests Ordered: Current medicines are reviewed at length with the patient today.  Concerns regarding medicines are outlined above.  Orders Placed This Encounter  Procedures  . Basic metabolic panel  . Pro b  natriuretic peptide  . TSH  . T3, free  . T4, free  . EKG 12-Lead  . ECHOCARDIOGRAM COMPLETE   Meds ordered this encounter  Medications  . furosemide (LASIX) 40 MG tablet    Sig: Take 1 tablet (40 mg total) by mouth daily.    Dispense:  30 tablet    Refill:  11    Dose change    Patient Instructions  Medication Instructions:  1) TAKE Furosemide 40mg  once daily for 7 days and then only take it as needed for swelling.   *If you need a refill on your cardiac medications before your next appointment, please call your pharmacy*   Lab Work: BMET, Pro BNP, TSH, Free T4, Free T3 today  If you have labs (blood work) drawn today and your tests are completely normal, you will receive your results only by: MyChart Message (if you have MyChart) OR . A paper copy in the mail If you have any lab test that is abnormal or we need to change your treatment, we will call you to review the results.   Testing/Procedures: Your physician has requested that you have an echocardiogram in December. Echocardiography is a painless test that uses sound waves to create images of your heart. It provides your doctor with information about the size and shape of your heart and how well your heart's chambers and valves are working. This procedure takes approximately one hour. There are no restrictions for this procedure.     Follow-Up: At [facility], you and your health needs are our priority.  As part of our  continuing mission to provide you with exceptional heart care, we have created designated Provider Care Teams.  These Care Teams include your primary Cardiologist (physician) and Advanced Practice Providers (APPs -  Physician Assistants and Nurse Practitioners) who all work together to provide you with the care you need, when you need it.  We recommend signing up for the patient portal called "MyChart".  Sign up information is provided on this After Visit Summary.  MyChart is used to connect with patients for Virtual Visits (Telemedicine).  Patients are able to view lab/test results, encounter notes, upcoming appointments, etc.  Non-urgent messages can be sent to your provider as well.   To learn more about what you can do with MyChart, go to [facility].    Your next appointment:   3 month(s)  The format for your next appointment:   In Person  Provider:   You may see [email or web address], MD or one of the following Advanced Practice Providers on your designated Care Team:    Tobias Alexander, PA-C  Ronie Spies, PA-C    Other Instructions      Signed, Jacolyn Reedy, MD  09/10/2020 11:27 AM    [facility]

## 2020-09-11 ENCOUNTER — Telehealth: Payer: Self-pay | Admitting: *Deleted

## 2020-09-11 LAB — BASIC METABOLIC PANEL
BUN/Creatinine Ratio: 30 — ABNORMAL HIGH (ref 12–28)
BUN: 21 mg/dL (ref 8–27)
CO2: 24 mmol/L (ref 20–29)
Calcium: 9.5 mg/dL (ref 8.7–10.3)
Chloride: 105 mmol/L (ref 96–106)
Creatinine, Ser: 0.7 mg/dL (ref 0.57–1.00)
GFR calc Af Amer: 105 mL/min/{1.73_m2} (ref >59)
GFR calc non Af Amer: 91 mL/min/{1.73_m2} (ref >59)
Glucose: 120 mg/dL — ABNORMAL HIGH (ref 65–99)
Potassium: 3.8 mmol/L (ref 3.5–5.2)
Sodium: 140 mmol/L (ref 134–144)

## 2020-09-11 LAB — T4, FREE: Free T4: 3.45 ng/dL — ABNORMAL HIGH (ref 0.82–1.77)

## 2020-09-11 LAB — T3, FREE: T3, Free: 10.4 pg/mL — ABNORMAL HIGH (ref 2.0–4.4)

## 2020-09-11 LAB — TSH: TSH: <0.005 u[IU]/mL — ABNORMAL LOW (ref 0.450–4.500)

## 2020-09-11 LAB — PRO B NATRIURETIC PEPTIDE: NT-Pro BNP: 1486 pg/mL — ABNORMAL HIGH (ref 0–301)

## 2020-09-11 NOTE — Telephone Encounter (Signed)
-----   Message from Lars Masson, MD sent at 09/11/2020 10:53 AM EDT ----- Please instruct to take lasix 40 mg po daily until the next follow up visit.

## 2020-09-11 NOTE — Telephone Encounter (Signed)
Pt made aware of lab results and recommendations per Dr. Delton See.  Pt is aware to take her lasix 40 mg po daily until she comes back to the office for follow-up, in the next few months.  Lasix dose change of 40 mg po daily was sent into the pts pharmacy yesterday, while see Dr. Delton See in clinic.  Pt verbalized understanding and agrees with this plan.

## 2020-09-11 NOTE — Telephone Encounter (Signed)
Pt had BMET on 10/25.   Will forward to PCCs to make aware.

## 2020-09-11 NOTE — Telephone Encounter (Signed)
Noted  

## 2020-09-12 ENCOUNTER — Ambulatory Visit (INDEPENDENT_AMBULATORY_CARE_PROVIDER_SITE_OTHER)
Admission: RE | Admit: 2020-09-12 | Discharge: 2020-09-12 | Disposition: A | Discharge status: Home / Self Care | Payer: Medicare Other | Source: Ambulatory Visit | Attending: Emergency Medicine | Admitting: Emergency Medicine

## 2020-09-12 ENCOUNTER — Other Ambulatory Visit: Payer: Self-pay

## 2020-09-12 DIAGNOSIS — D869 Sarcoidosis, unspecified: Secondary | ICD-10-CM | POA: Diagnosis not present

## 2020-09-12 MED ORDER — IOHEXOL 300 MG/ML  SOLN
80.0000 mL | Freq: Once | INTRAMUSCULAR | Status: AC | PRN
  Administered 2020-09-12: 80 mL via INTRAVENOUS

## 2020-10-02 ENCOUNTER — Telehealth: Payer: Self-pay | Admitting: Cardiology

## 2020-10-02 ENCOUNTER — Telehealth: Payer: Self-pay | Admitting: Emergency Medicine

## 2020-10-02 NOTE — Telephone Encounter (Signed)
Ask her to temporarily stop the Breo to see if the cough improves. She can use albuterol if needed.  I want her to be seen - she was going to f/u w PFT, can we arrange for this

## 2020-10-02 NOTE — Telephone Encounter (Signed)
Patient had some questions she wanted to discuss with Dr. Delton See or her Nurse.

## 2020-10-02 NOTE — Telephone Encounter (Signed)
Pt was seen on 10/21 by RB--restarted Breo  Pt stated that she is having a dry, hacking cough that is worse at night.  She is still having some wheezing and SHOB with activity. She did take a covid test at home and this was negative.  She denies any fever, body aches or chills. Pt is wanting to know what RB recs for her.  Please advise. Thanks   Allergies  Allergen Reactions  . Shellfish Allergy Itching  . Sulfa Antibiotics Other (See Comments)    Unknown

## 2020-10-02 NOTE — Telephone Encounter (Signed)
Pt was calling to ask if there was any contraindication from a cardiac perspective as to why she shouldn't get her Covid booster shot done on this Friday.   Provided information below that we endorse to all our cardiac pts calling in requesting if its safe to proceed with this vaccination while being a cardiac pt and/or taking cardiac meds. Below was endorsed to the pt:  We are recommending the COVID-19 vaccine to all of our patients. Cardiac medications (including blood thinners) should not deter anyone from being vaccinated and there is no need to hold any of those medications prior to vaccine administration.    If you have further questions or concerns about the vaccine process, please visit [email or web address] or contact your primary care physician.   Pt verbalized understanding and agrees with this plan. Pt was more than gracious for all the assistance provided.

## 2020-10-02 NOTE — Telephone Encounter (Signed)
Spoke with pt, aware of recs.  Pt coming in tomorrow to be seen.  Pt is scheduled for PFT on 12/14 and there are no sooner openings for PFT at this time.  Will keep as scheduled.  Nothing further needed at this time- will close encounter.

## 2020-10-03 ENCOUNTER — Ambulatory Visit: Payer: Medicare Other | Admitting: Emergency Medicine

## 2020-10-03 ENCOUNTER — Other Ambulatory Visit: Payer: Self-pay

## 2020-10-03 ENCOUNTER — Encounter: Payer: Self-pay | Admitting: Emergency Medicine

## 2020-10-03 DIAGNOSIS — D869 Sarcoidosis, unspecified: Secondary | ICD-10-CM | POA: Diagnosis not present

## 2020-10-03 DIAGNOSIS — R059 Cough, unspecified: Secondary | ICD-10-CM | POA: Diagnosis not present

## 2020-10-03 MED ORDER — PANTOPRAZOLE SODIUM 40 MG PO TBEC: 40.0000 mg | DELAYED_RELEASE_TABLET | Freq: Every day | ORAL | 0 refills | Status: DC

## 2020-10-03 MED ORDER — HYDROCODONE-HOMATROPINE 5-1.5 MG/5ML PO SYRP
5.0000 mL | ORAL_SOLUTION | Freq: Four times a day (QID) | ORAL | 0 refills | Status: DC | PRN
Start: 2020-10-03 — End: 2021-09-19

## 2020-10-03 MED ORDER — PREDNISONE 10 MG PO TABS: 30.0000 mg | ORAL_TABLET | Freq: Every day | ORAL | 0 refills | Status: AC

## 2020-10-03 NOTE — Assessment & Plan Note (Signed)
Based on her description I think this is upper airway irritation.  GERD is clearly a factor as her GERD is been poorly controlled.  Also she is starting have some rhinitis.  Question whether the powdered formulation of Breo may have been a contributor as well.  I will hold it temporarily.  I will ramp-up her GERD therapy, she will start fluticasone as she had planned to do.  Breo prednisone burst to help with upper airway inflammation.  Hycodan for cough suppression.  I do not believe that this is a true sarcoid flare based on the absence of groundglass or interstitial infiltrates on her CT chest from 10/27. Please take prednisone 30 mg daily for 5 days and then stop. Continue to hold off on your Breo for another 5 days.  At that time you can restart 1 inhalation once daily.  Rinse and gargle after using. We will increase your Protonix to 40 mg twice a day for 2 weeks, then decrease to 40 mg once a day (this is a new milligrams strength) Try to work on modifying your diet to avoid reflux, heartburn. Agree with restarting your fluticasone nasal spray, 2 sprays each nostril once daily. Continue your montelukast (Singulair) 10 mg each evening Use Hycodan 5 cc up to every 6 hours if needed for cough suppression. You can get your COVID-19 booster and your flu shot about 1 week after your prednisone is finished Follow with Dr Delton Coombes in 3 months or sooner if you have any problems

## 2020-10-03 NOTE — Progress Notes (Signed)
Subjective:    Patient ID: Diana Calhoun, female    DOB: 11-27-1955, 65 y.o.   MRN: [id number]  HPI 64 year old woman with history of obesity, hyperlipidemia, GERD, mitral regurgitation.  She carries a diagnosis of pulmonary and cardiac sarcoidosis that was made by mediastinoscopy in [place] after she was found to have LAD and infiltrates ~28 yrs ago. She has had rash, particularly on her back.  She had a sarcoid pericardial effusion that required window back in [place]. Has been on prolonged steroids before but not lately. No tapers in 2-3 years.  She is feeling bad - she has noticed sluggishness, hot / flushed, some poor sleep, increased LE edema. Her PCP is evaluating thyroid disease. She has some visual changes over about 2 months. Her breathing has been stable. Minimal cough. The rash is on her back, itches.  She was formally on Breo, ran out last week. Misses it when off.  Uses albuterol about 2x a day since off Breo.  She saw optho about 9 months ago here locally.   Echocardiogram 05/31/2020 shows intact LVEF, grade 2 diastolic dysfunction, mitral valve posterior leaflet calcification and prolapse with severe MR, normal right ventricular size and function and normal PASP.  TEE 06/11/20 confirmed intact RV function and normal pulmonary pressures, reassuring mitral valve function  She is COVID vaccinated. Needs the flu shot this fall. She has never had PNA shot.   Acute OV 10/03/20 --this is acute visit for 64 year old woman with significant mediastinal lymphadenopathy and cardiac involvement due to sarcoidosis.  Also with a history of obesity, hyperlipidemia, GERD, mitral regurgitation.  At our last visit we restarted Breo as she felt that she had been missing it, having more dyspnea without it.  She returns today reporting that she has had UA irritation, throat clearing, non-productive cough. Her breathing did improve some when Breo was restarted. The cough came about 2 weeks after. She is starting to  have some nasal congestion - started flonase yesterday. She is having a lot of reflux - seems to be worse since 3 weeks ago, ? Dietary indiscretion.   Repeat CT scan chest done 09/12/2020 reviewed by me, shows bulky by hilar, subcarinal and paratracheal lymphadenopathy, left also enlarged but less so than the right.  Groundglass left basilar nodule 9 x 8 mm.  There is no septal thickening, bronchiectasis or ILD.   Review of Systems As per HPI      Objective:   Physical Exam  Vitals:   10/03/20 0902  BP: 138/70  Pulse: (!) 109  Temp: 97.7 F (36.5 C)  SpO2: 97%  Weight: 228 lb 3.2 oz (103.5 kg)  Height: 5\' 5"  (1.651 m)   Gen: Pleasant, overwt woman, in no distress,  normal affect  ENT: No lesions,  mouth clear,  oropharynx clear, no postnasal drip  Neck: No JVD, no stridor  Lungs: No use of accessory muscles, no crackles or wheezing on normal respiration, no wheeze on forced expiration  Cardiovascular: RRR, heart sounds normal, no murmur or gallops, no peripheral edema  Musculoskeletal: No deformities, no cyanosis or clubbing  Neuro: alert, awake, non focal  Skin: Warm, healing raised non-pigmented rash mid upper back, some scabs from scratching.      Assessment & Plan:  Cough Based on her description I think this is upper airway irritation.  GERD is clearly a factor as her GERD is been poorly controlled.  Also she is starting have some rhinitis.  Question whether the powdered formulation of Breo  may have been a contributor as well.  I will hold it temporarily.  I will ramp-up her GERD therapy, she will start fluticasone as she had planned to do.  Breo prednisone burst to help with upper airway inflammation.  Hycodan for cough suppression.  I do not believe that this is a true sarcoid flare based on the absence of groundglass or interstitial infiltrates on her CT chest from 10/27. Please take prednisone 30 mg daily for 5 days and then stop. Continue to hold off on your Breo  for another 5 days.  At that time you can restart 1 inhalation once daily.  Rinse and gargle after using. We will increase your Protonix to 40 mg twice a day for 2 weeks, then decrease to 40 mg once a day (this is a new milligrams strength) Try to work on modifying your diet to avoid reflux, heartburn. Agree with restarting your fluticasone nasal spray, 2 sprays each nostril once daily. Continue your montelukast (Singulair) 10 mg each evening Use Hycodan 5 cc up to every 6 hours if needed for cough suppression. You can get your COVID-19 booster and your flu shot about 1 week after your prednisone is finished Follow with Dr Delton Coombes in 3 months or sooner if you have any problems   Sarcoidosis CT scan of the chest reassuring with bulky lymphadenopathy but no parenchymal disease to speak of to suggest flaring.  If her cough continues, dyspnea progresses then we would have to reconsider a longer course of prednisone.  I will get her back on the Breo once her upper airway irritation is improved  Levy Pupa, MD, PhD 10/03/2020, 9:30 AM [facility] [phone] or if no answer [phone]

## 2020-10-03 NOTE — Addendum Note (Signed)
Addended by: Dorisann Frames R on: 10/03/2020 10:00 AM   Modules accepted: Orders

## 2020-10-03 NOTE — Patient Instructions (Addendum)
Please take prednisone 30 mg daily for 5 days and then stop. Continue to hold off on your Breo for another 5 days.  At that time you can restart 1 inhalation once daily.  Rinse and gargle after using. We will increase your Protonix to 40 mg twice a day for 2 weeks, then decrease to 40 mg once a day (this is a new milligrams strength) Try to work on modifying your diet to avoid reflux, heartburn. Agree with restarting your fluticasone nasal spray, 2 sprays each nostril once daily. Continue your montelukast (Singulair) 10 mg each evening Use Hycodan 5 cc up to every 6 hours if needed for cough suppression. Your CT scan of the chest showed enlarged lymph nodes consistent with your sarcoidosis. You can get your COVID-19 booster and your flu shot about 1 week after your prednisone is finished Follow with Dr Delton Coombes in 3 months or sooner if you have any problems.

## 2020-10-03 NOTE — Assessment & Plan Note (Signed)
CT scan of the chest reassuring with bulky lymphadenopathy but no parenchymal disease to speak of to suggest flaring.  If her cough continues, dyspnea progresses then we would have to reconsider a longer course of prednisone.  I will get her back on the Breo once her upper airway irritation is improved

## 2020-10-08 ENCOUNTER — Telehealth: Payer: Self-pay | Admitting: Emergency Medicine

## 2020-10-08 NOTE — Telephone Encounter (Signed)
Patient is returning phone call. Patient phone number is [phone] h or [phone] c.

## 2020-10-08 NOTE — Telephone Encounter (Signed)
Called and spoke to pt. Pt states she is still coughing and has some SOB but she states she feels she is improving. Pt questioning if she can restart her Breo. Pt states she is going out of town to [place] and is nervous leaving town if she were to need medication while out of town. Advised pt per Dr. Kavin Leech last OV note he instructed pt to restart her Breo after 5 days. Pt verbalized understanding and states she will restart the med. Pt also aware that she can call us if she is out of town and if her s/s worsen while she is out of town and she needs medication called in then we can send to a pharmacy near her. Pt verbalized understanding and denied any further questions or concerns at this time.

## 2020-10-08 NOTE — Telephone Encounter (Signed)
LMTCB

## 2020-10-17 ENCOUNTER — Ambulatory Visit (HOSPITAL_COMMUNITY): Payer: Medicare Other | Attending: Cardiovascular Disease

## 2020-10-17 ENCOUNTER — Other Ambulatory Visit: Payer: Self-pay

## 2020-10-17 DIAGNOSIS — I341 Nonrheumatic mitral (valve) prolapse: Secondary | ICD-10-CM | POA: Diagnosis not present

## 2020-10-17 DIAGNOSIS — I340 Nonrheumatic mitral (valve) insufficiency: Secondary | ICD-10-CM

## 2020-10-17 LAB — ECHOCARDIOGRAM COMPLETE
Area-P 1/2: 3.42 cm2
S' Lateral: 2.6 cm

## 2020-10-17 MED ORDER — PERFLUTREN LIPID MICROSPHERE
1.0000 mL | INTRAVENOUS | Status: AC | PRN
  Administered 2020-10-17: 2 mL via INTRAVENOUS

## 2020-10-19 ENCOUNTER — Emergency Department (HOSPITAL_COMMUNITY)
Admission: EM | Admit: 2020-10-19 | Discharge: 2020-10-20 | Disposition: A | Discharge status: Home / Self Care | Payer: Medicare Other | Attending: Emergency Medicine | Admitting: Emergency Medicine

## 2020-10-19 ENCOUNTER — Other Ambulatory Visit: Payer: Self-pay

## 2020-10-19 ENCOUNTER — Encounter (HOSPITAL_COMMUNITY): Payer: Self-pay | Admitting: *Deleted

## 2020-10-19 DIAGNOSIS — E0590 Thyrotoxicosis, unspecified without thyrotoxic crisis or storm: Secondary | ICD-10-CM | POA: Diagnosis not present

## 2020-10-19 DIAGNOSIS — R000 Tachycardia, unspecified: Secondary | ICD-10-CM

## 2020-10-19 DIAGNOSIS — R5383 Other fatigue: Secondary | ICD-10-CM | POA: Diagnosis present

## 2020-10-19 DIAGNOSIS — R0602 Shortness of breath: Secondary | ICD-10-CM | POA: Diagnosis not present

## 2020-10-19 LAB — BASIC METABOLIC PANEL
Anion gap: 11 (ref 5–15)
BUN: 24 mg/dL — ABNORMAL HIGH (ref 8–23)
CO2: 26 mmol/L (ref 22–32)
Calcium: 9.9 mg/dL (ref 8.9–10.3)
Chloride: 102 mmol/L (ref 98–111)
Creatinine, Ser: 0.87 mg/dL (ref 0.44–1.00)
GFR, Estimated: >60 mL/min (ref >60)
Glucose, Bld: 114 mg/dL — ABNORMAL HIGH (ref 70–99)
Potassium: 3.5 mmol/L (ref 3.5–5.1)
Sodium: 139 mmol/L (ref 135–145)

## 2020-10-19 LAB — URINALYSIS, ROUTINE W REFLEX MICROSCOPIC
Bilirubin Urine: NEGATIVE
Glucose, UA: NEGATIVE mg/dL
Hgb urine dipstick: NEGATIVE
Ketones, ur: NEGATIVE mg/dL
Nitrite: NEGATIVE
Protein, ur: NEGATIVE mg/dL
Specific Gravity, Urine: 1.02 (ref 1.005–1.030)
pH: 5 (ref 5.0–8.0)

## 2020-10-19 LAB — CBC
HCT: 36.5 % (ref 36.0–46.0)
Hemoglobin: 11.4 g/dL — ABNORMAL LOW (ref 12.0–15.0)
MCH: 27.1 pg (ref 26.0–34.0)
MCHC: 31.2 g/dL (ref 30.0–36.0)
MCV: 86.7 fL (ref 80.0–100.0)
Platelets: 168 10*3/uL (ref 150–400)
RBC: 4.21 MIL/uL (ref 3.87–5.11)
RDW: 12.3 % (ref 11.5–15.5)
WBC: 5 10*3/uL (ref 4.0–10.5)
nRBC: 0 % (ref 0.0–0.2)

## 2020-10-19 LAB — TSH: TSH: <0.01 u[IU]/mL — ABNORMAL LOW (ref 0.350–4.500)

## 2020-10-19 NOTE — ED Triage Notes (Signed)
Pt says that she has had palpitations, weakness, fatigue, loss of appetite, diarrhea, and some throat pain. Pt has hyperthyroidism.  Went to UC prior, sent here to r/o thyroid storm.

## 2020-10-20 ENCOUNTER — Emergency Department (HOSPITAL_COMMUNITY): Payer: Medicare Other

## 2020-10-20 LAB — HEPATIC FUNCTION PANEL
ALT: 33 U/L (ref 0–44)
AST: 36 U/L (ref 15–41)
Albumin: 2.9 g/dL — ABNORMAL LOW (ref 3.5–5.0)
Alkaline Phosphatase: 52 U/L (ref 38–126)
Bilirubin, Direct: 0.1 mg/dL (ref 0.0–0.2)
Indirect Bilirubin: 0.7 mg/dL (ref 0.3–0.9)
Total Bilirubin: 0.8 mg/dL (ref 0.3–1.2)
Total Protein: 6.5 g/dL (ref 6.5–8.1)

## 2020-10-20 LAB — TROPONIN I (HIGH SENSITIVITY): Troponin I (High Sensitivity): 11 ng/L (ref <18)

## 2020-10-20 LAB — BRAIN NATRIURETIC PEPTIDE: B Natriuretic Peptide: 26 pg/mL (ref 0.0–100.0)

## 2020-10-20 MED ORDER — PROPRANOLOL HCL 10 MG PO TABS: 10.0000 mg | ORAL_TABLET | Freq: Three times a day (TID) | ORAL | 0 refills | Status: DC

## 2020-10-20 MED ORDER — DILTIAZEM HCL 30 MG PO TABS: 30.0000 mg | ORAL_TABLET | Freq: Four times a day (QID) | ORAL | 0 refills | Status: DC

## 2020-10-20 MED ORDER — METHIMAZOLE 10 MG PO TABS: 20.0000 mg | ORAL_TABLET | Freq: Every day | ORAL | 0 refills | Status: DC

## 2020-10-20 MED ORDER — BREO ELLIPTA 200-25 MCG/INH IN AEPB: 1.0000 | INHALATION_SPRAY | Freq: Every day | RESPIRATORY_TRACT | 0 refills | Status: DC

## 2020-10-20 MED ORDER — PROPRANOLOL HCL 10 MG PO TABS
10.0000 mg | ORAL_TABLET | Freq: Once | ORAL | Status: AC
  Administered 2020-10-20: 10 mg via ORAL
  Filled 2020-10-20: qty 1

## 2020-10-20 NOTE — ED Provider Notes (Signed)
[facility] EMERGENCY DEPARTMENT Provider Note   CSN: [id number] Arrival date & time: 10/19/20  2124     History Chief Complaint  Patient presents with  . Fatigue    Diana Calhoun is a 64 y.o. female.  Presents to ER with myriad symptoms.  Reports that over the last few weeks she has had some generalized weakness, fatigue, poor appetite, occasional episodes of diarrhea.  Reports that over the last few days couple weeks her symptoms have all worsened.  Reports that she went to urgent care which sent her to ER to rule out thyroid storm.  She currently has no palpitations at rest, no shortness of breath at rest.  No chest pain.  She does endorse generalized weakness and some shortness of breath with exertion.  Per review of chart, patient was diagnosed with hypothyroidism, recent outpatient labs showed hyperthyroidism, low TSH and elevated T3 and T4.  Patient was referred to outpatient endocrinology, has an appointment on 12/13.  HPI     Past Medical History:  Diagnosis Date  . Body mass index (BMI) of 30.0-30.9 in adult   . Chest discomfort   . Familial hypercholesterolemia   . GERD (gastroesophageal reflux disease)   . Mitral valve regurgitation   . Pulmonary sarcoidosis ([place])   . Thyroid disease     Patient Active Problem List   Diagnosis Date Noted  . Cough 10/03/2020  . Sarcoidosis 09/06/2020  . Mitral regurgitation 06/05/2020    Past Surgical History:  Procedure Laterality Date  . CESAREAN SECTION    . PERCARDIUM INFUSION    . TEE WITHOUT CARDIOVERSION N/A 06/11/2020   Procedure: TRANSESOPHAGEAL ECHOCARDIOGRAM (TEE);  Surgeon: Josue Hector, MD;  Location: [facility] ENDOSCOPY;  Service: Cardiovascular;  Laterality: N/A;     OB History   No obstetric history on file.     Family History  Problem Relation Age of Onset  . Hypertension Mother   . Diabetes Sister   . Diabetes Sister     Social History   Tobacco Use  . Smoking status: Never  Smoker  . Smokeless tobacco: Never Used  Substance Use Topics  . Alcohol use: Not on file  . Drug use: Not on file    Home Medications Prior to Admission medications   Medication Sig Start Date End Date Taking? Authorizing Provider  acetaminophen (TYLENOL) 650 MG CR tablet Take 650 mg by mouth every 8 (eight) hours as needed for pain (Hip pain).   Yes [provider]  fluticasone furoate-vilanterol (BREO ELLIPTA) 200-25 MCG/INH AEPB Inhale 1 puff into the lungs daily. 09/06/20  Yes Collene Gobble, MD  furosemide (LASIX) 40 MG tablet Take 1 tablet (40 mg total) by mouth daily. Patient taking differently: Take 40 mg by mouth every other day.  09/10/20  Yes Dorothy Spark, MD  HYDROcodone-homatropine [facility]) 5-1.5 MG/5ML syrup Take 5 mLs by mouth every 6 (six) hours as needed for cough. 10/03/20  Yes Collene Gobble, MD  montelukast (SINGULAIR) 10 MG tablet Take 10 mg by mouth at bedtime as needed (allergies).    Yes [provider]  pantoprazole (PROTONIX) 40 MG tablet Take 1 tablet (40 mg total) by mouth daily. 10/03/20  Yes Collene Gobble, MD  traZODone (DESYREL) 50 MG tablet Take 50 mg by mouth at bedtime as needed for sleep.   Yes [provider]    Allergies    Shellfish allergy and Sulfa antibiotics  Review of Systems   Review of  Systems  Constitutional: Positive for fatigue. Negative for chills and fever.  HENT: Negative for ear pain and sore throat.   Eyes: Negative for pain and visual disturbance.  Respiratory: Negative for cough and shortness of breath.   Cardiovascular: Negative for chest pain and palpitations.  Gastrointestinal: Positive for diarrhea and nausea. Negative for abdominal pain and vomiting.  Genitourinary: Negative for dysuria and hematuria.  Musculoskeletal: Negative for arthralgias and back pain.  Skin: Negative for color change and rash.  Neurological: Positive for weakness. Negative for seizures and syncope.  All other  systems reviewed and are negative.   Physical Exam Updated Vital Signs BP (!) 135/46 (BP Location: Right Arm)   Pulse (!) 111   Temp 97.8 F (36.6 C) (Oral)   Resp 15   SpO2 99%   Physical Exam Vitals and nursing note reviewed.  Constitutional:      General: She is not in acute distress.    Appearance: She is well-developed.  HENT:     Head: Normocephalic and atraumatic.  Eyes:     Conjunctiva/sclera: Conjunctivae normal.  Cardiovascular:     Rate and Rhythm: Regular rhythm. Tachycardia present.     Heart sounds: No murmur heard.   Pulmonary:     Effort: Pulmonary effort is normal. No respiratory distress.     Breath sounds: Normal breath sounds.  Abdominal:     Palpations: Abdomen is soft.     Tenderness: There is no abdominal tenderness.  Musculoskeletal:        General: No deformity or signs of injury.     Cervical back: Neck supple.  Skin:    General: Skin is warm and dry.  Neurological:     General: No focal deficit present.     Mental Status: She is alert and oriented to person, place, and time.  Psychiatric:        Mood and Affect: Mood normal.        Behavior: Behavior normal.     ED Results / Procedures / Treatments   Labs (all labs ordered are listed, but only abnormal results are displayed) Labs Reviewed  BASIC METABOLIC PANEL - Abnormal; Notable for the following components:      Result Value   Glucose, Bld 114 (*)    BUN 24 (*)    All other components within normal limits  CBC - Abnormal; Notable for the following components:   Hemoglobin 11.4 (*)    All other components within normal limits  URINALYSIS, ROUTINE W REFLEX MICROSCOPIC - Abnormal; Notable for the following components:   Leukocytes,Ua SMALL (*)    Bacteria, UA RARE (*)    All other components within normal limits  TSH - Abnormal; Notable for the following components:   TSH <0.010 (*)    All other components within normal limits  BRAIN NATRIURETIC PEPTIDE  TROPONIN I (HIGH  SENSITIVITY)    EKG EKG Interpretation  Date/Time:  Friday October 19 2020 21:51:27 EST Ventricular Rate:  116 PR Interval:  130 QRS Duration: 84 QT Interval:  304 QTC Calculation: 422 R Axis:   45 Text Interpretation: Sinus tachycardia with occasional Premature ventricular complexes Otherwise normal ECG No old tracing to compare Confirmed by Delora Fuel ([id number]) on 10/20/2020 3:14:54 AM   Radiology DG Chest 2 View  Result Date: 10/20/2020 CLINICAL DATA:  Shortness of breath.  History of sarcoidosis. EXAM: CHEST - 2 VIEW COMPARISON:  CT scan of the chest September 12, 2020 FINDINGS: Hilar adenopathy again identified. The heart  size is borderline to mildly enlarged. Prominence of the AP window consistent with known adenopathy. No pneumothorax. Minimal atelectasis in the right base. No overt edema. No other acute abnormalities. IMPRESSION: 1. Known mediastinal and hilar adenopathy consistent with known sarcoidosis. 2. No overt pulmonary edema or other acute abnormalities. Electronically Signed   By: Dorise Bullion III M.D   On: 10/20/2020 11:35    Procedures Procedures (including critical care time)  Medications Ordered in ED Medications  propranolol (INDERAL) tablet 10 mg (has no administration in time range)    ED Course  I have reviewed the triage vital signs and the nursing notes.  Pertinent labs & imaging results that were available during my care of the patient were reviewed by me and considered in my medical decision making (see chart for details).  Clinical Course as of Oct 22 710  Sat Oct 20, 2020  1256 Reviewed case in detail with Dr. Buddy Duty with endocrinology - he recommended trial of propranolol as well as methimazole 31m daily. If patient has sided effects from beta blocker would need to change.   [RD]    Clinical Course User Index [RD] DLucrezia Starch MD   MDM Rules/Calculators/A&P                         64year old lady presenting to ER with myriad  symptoms including generalized weakness, fatigue, shortness of breath with exertion as well as intermittent palpitations.  On exam patient noted to be remarkably well-appearing in no distress.  She was noted to have some mild sinus tachycardia.  Suspect her symptoms are primarily related to underlying hyperthyroidism.  CXR negative, troponin and BNP within normal limits.  No arrhythmias on EKG.  As patient is not toxic appearing and has this reassuring work-up, do not feel patient is in frank thyroid storm.   I reviewed case in detail with her future endocrinologist who kindly took my call.  He recommended starting methimazole assuming LFTs were within normal limits and starting propranolol.  After providing dose of beta-blocker in ER, patient is said that it made her have a cough.  Reviewed with ED pharmacy, JCorene Cornea recommended alternate therapy of low-dose Cardizem.  Stressed need for close follow-up with endocrinology as well as pulmonology and cardiology.  Patient demonstrated understanding and is discharged home.  After the discussed management above, the patient was determined to be safe for discharge.  The patient was in agreement with this plan and all questions regarding their care were answered.  ED return precautions were discussed and the patient will return to the ED with any significant worsening of condition.    Final Clinical Impression(s) / ED Diagnoses Final diagnoses:  Hyperthyroidism  Sinus tachycardia    Rx / DC Orders ED Discharge Orders    None       DLucrezia Starch MD 10/22/20 [phone]

## 2020-10-20 NOTE — Discharge Instructions (Addendum)
Please start the medicines as discussed.  You should start the diltiazem for your heart rate and to the methimazole for your hyperthyroidism.  Please follow-up with your primary doctor, cardiology as well as endocrinology.  If you develop chest pain, difficulty in breathing, episodes of passing out or other new concern symptom, please return to ER immediately.

## 2020-10-20 NOTE — ED Notes (Signed)
DC instructions reviewed with pt. Pt verbalized understanding.  PT DC.  

## 2020-10-30 ENCOUNTER — Ambulatory Visit: Payer: Medicare Other | Admitting: Emergency Medicine

## 2020-11-19 ENCOUNTER — Encounter: Payer: Self-pay | Admitting: Physician Assistant

## 2020-11-19 NOTE — Progress Notes (Unsigned)
Cardiology Office Note    Date:  11/21/2020   ID:  Diana Calhoun, DOB 1956/06/05, MRN [id number]  PCP:  Seward Carol, MD  Cardiologist:  Ena Dawley, MD  Electrophysiologist:  None   Chief Complaint: f/u valvular disease  History of Present Illness:   Diana Calhoun is a 65 y.o. female with history of sarcoidosis, mitral valve prolapse, mitral regurgitation, pericardial effusion s/p prior pericardial window ~20 years ago, GERD, HLD, hyperthyroidism who presents for follow-up.   Dr. Francesca Oman note reviewed. 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 She also reportedly recently underwent calcium scoring in January of this year that showed calcium score of 0. She had a prior echo  in 2019 that showed LVEF 55%, mild-moderate MR, mild TR, mildly elevated RVSP. 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 TEE was pursued with results below showing no prolapse but restricted posterior leaflet motion with moderate-severe MR and moderate TR. She was recently seen back in October by Dr. Meda Coffee for follow-up of SOB. She had seen Dr. Lamonte Sakai, pulmonology, who had started her on Breo with improvement in symptoms, pending consideration for steroid therapy for sarcoid disease. She had also been diagnosed with hyperthyroidism and was pending f/u with endocrinology. Dr. Meda Coffee added Lasix 40mg  daily. Repeat echo 10/17/20 was felt to be stable with EF 60-65%, grade 1 DD, mild-moderate LAE, posterior MV leaflet motion restricted with moderate MR without significant change from prior except no longer with elevated L or R atrial pressures.  She returns for follow-up feeling like she is doing well from a cardiac standpoint. Of note, her Lasix was originally started for edema. She reports she only took the Lasix every other day for a week and then went to once a week and feels  like this is working well for her. She did end up in the emergency room in December with weakness, diarrhea, poor appetite, dizziness, and sinus tachycardia (without palpitations). Symptoms were felt related to continued thyroid disease (now on methimazole). She was trialed on propranolol but this reportedly gave her SOB/cough soon after so she was started on short acting diltiazem instead. She does report feeling somewhat better on this although still plagued with some of the other symptoms felt related to thyroid disease (GI upset, weight loss). She is managed closely by Dr. Buddy Duty now. She exercises an hour several days a week, 30 minutes walking, some weight training. She reports her DOE is much better from what it was earlier this year. No chest pain.   Labwork independently reviewed: 10/2020 albumin 2.9, AST/ALT wnl, troponin negative, BNP 26, TSH <0.010 (had been low previously as well - was still pending endocrine appt at that time) 08/2020 pBNP 1486 05/2020 LDL 114   Past Medical History:  Diagnosis Date  . Body mass index (BMI) of 30.0-30.9 in adult   . Familial hypercholesterolemia   . GERD (gastroesophageal reflux disease)   . Hyperthyroidism   . Mitral valve regurgitation   . MVP (mitral valve prolapse)   . Pulmonary sarcoidosis ([place])   . Tricuspid regurgitation     Past Surgical History:  Procedure Laterality Date  . CESAREAN SECTION    . PERCARDIUM INFUSION    . TEE WITHOUT CARDIOVERSION N/A 06/11/2020   Procedure: TRANSESOPHAGEAL ECHOCARDIOGRAM (TEE);  Surgeon: Josue Hector, MD;  Location: [place];  Service: Cardiovascular;  Laterality: N/A;  Current Medications: Current Meds  Medication Sig  . acetaminophen (TYLENOL) 650 MG CR tablet Take 650 mg by mouth every 8 (eight) hours as needed for pain (Hip pain).  . fluticasone furoate-vilanterol (BREO ELLIPTA) 200-25 MCG/INH AEPB Inhale 1 puff into the lungs daily.  . furosemide (LASIX) 40 MG tablet Take 1 tablet (40  mg total) by mouth daily. (Patient taking differently: Take 40 mg by mouth every other day.)  . HYDROcodone-homatropine (HYCODAN) 5-1.5 MG/5ML syrup Take 5 mLs by mouth every 6 (six) hours as needed for cough.  . methimazole (TAPAZOLE) 10 MG tablet Take 2 tablets (20 mg total) by mouth daily for 10 days. (Patient taking differently: Take 1 tablet (10 mg) by mouth 3 (three) times daily.)  . montelukast (SINGULAIR) 10 MG tablet Take 10 mg by mouth at bedtime as needed (allergies).   . pantoprazole (PROTONIX) 40 MG tablet Take 1 tablet (40 mg total) by mouth daily.  . Selenium 100 MCG CAPS Take 2 capsules by mouth daily.      Allergies:   Shellfish allergy and Sulfa antibiotics   Social History   Socioeconomic History  . Marital status: Single    Spouse name: Not on file  . Number of children: Not on file  . Years of education: Not on file  . Highest education level: Not on file  Occupational History  . Not on file  Tobacco Use  . Smoking status: Never Smoker  . Smokeless tobacco: Never Used  Substance and Sexual Activity  . Alcohol use: Not on file  . Drug use: Not on file  . Sexual activity: Not on file  Other Topics Concern  . Not on file  Social History Narrative  . Not on file   Social Determinants of Health   Financial Resource Strain: Not on file  Food Insecurity: Not on file  Transportation Needs: Not on file  Physical Activity: Not on file  Stress: Not on file  Social Connections: Not on file     Family History:  The patient's family history includes Diabetes in her sister and sister; Hypertension in her mother.  ROS:   Please see the history of present illness. Occasional mild edema. All other systems are reviewed and otherwise negative.    EKGs/Labs/Other Studies Reviewed:    Studies reviewed are outlined and summarized above. Reports included below if pertinent.  10/17/20 1. Left ventricular ejection fraction, by estimation, is 60 to 65%. The  left  ventricle has normal function. The left ventricle has no regional  wall motion abnormalities. Left ventricular diastolic parameters are  consistent with Grade I diastolic  dysfunction (impaired relaxation).  2. Right ventricular systolic function is normal. The right ventricular  size is normal. Tricuspid regurgitation signal is inadequate for assessing  PA pressure.  3. Left atrial size was mild to moderately dilated.  4. Mitral valve changes suggest rheumatic or other postinflammatory  change. The posterior leaflet motion is restricted. The mitral  regurgitation jet is very eccentric and its severity may be  underestimated. Moderate mitral valve regurgitation. No  evidence of mitral stenosis.  5. The aortic valve is normal in structure. Aortic valve regurgitation is  not visualized. No aortic stenosis is present.  6. The inferior vena cava is normal in size with greater than 50%  respiratory variability, suggesting right atrial pressure of 3 mmHg.   Comparison(s): Prior images reviewed side by side. There is no significant  change in left ventricular systolic function or mitral valve findings, but  there is no longer evidence of elevated left or right atrial pressures.   06/11/20 TEE IMPRESSIONS    1. Left ventricular ejection fraction, by estimation, is 60 to 65%. The  left ventricle has normal function. The left ventricle has no regional  wall motion abnormalities.  2. Right ventricular systolic function is normal. The right ventricular  size is normal. There is normal pulmonary artery systolic pressure.  3. Left atrial size was moderately dilated. No left atrial/left atrial  appendage thrombus was detected.  4. No prolapse. Carpentier type 3A restricted posterior leaflet motion  Pisa Radius 0.8 , ERO .39 and RV 55 suggest moderate to severe disease. 3D  VC 0.87 suggests worse but there appears to be two MR jets one central and  one lateral which makes  assessing 3D  VC difficult There was blunted systolic forward flow in the  PV;s but clearly no reversal . The mitral valve is abnormal. Moderate to  severe mitral valve regurgitation. No evidence of mitral stenosis.  5. Tricuspid valve regurgitation is moderate.  6. The aortic valve is tricuspid. Aortic valve regurgitation is not  visualized. No aortic stenosis is present.  7. The inferior vena cava is normal in size with greater than 50%  respiratory variability, suggesting right atrial pressure of 3 mmHg.   Conclusion(s)/Recommendation(s): Normal biventricular function without  evidence of hemodynamically significant valvular heart disease.     EKG:  EKG is ordered today, personally reviewed, demonstrating NSR 92bpm without acute STT changes  Recent Labs: 09/10/2020: NT-Pro BNP 1,486 10/19/2020: BUN 24; Creatinine, Ser 0.87; Hemoglobin 11.4; Platelets 168; Potassium 3.5; Sodium 139; TSH <0.010 10/20/2020: ALT 33; B Natriuretic Peptide 26.0  Recent Lipid Panel    Component Value Date/Time   CHOL 181 05/31/2020 0818   TRIG 63 05/31/2020 0818   HDL 55 05/31/2020 0818   CHOLHDL 3.3 05/31/2020 0818   LDLCALC 114 (H) 05/31/2020 0818    PHYSICAL EXAM:    VS:  BP 118/68   Pulse 92   Ht 5\' 5"  (1.651 m)   Wt 221 lb 9.6 oz (100.5 kg)   SpO2 96%   BMI 36.88 kg/m   BMI: Body mass index is 36.88 kg/m.  GEN: Well nourished, well developed AAF, in no acute distress HEENT: normocephalic, atraumatic Neck: no JVD, carotid bruits, or masses Cardiac: RRR; very soft SEM LSB, no rubs or gallops, no edema  Respiratory:  clear to auscultation bilaterally, normal work of breathing GI: soft, nontender, nondistended, + BS MS: no deformity or atrophy Skin: warm and dry, no rash Neuro:  Alert and Oriented x 3, Strength and sensation are intact, follows commands Psych: euthymic mood, full affect  Wt Readings from Last 3 Encounters:  11/21/20 221 lb 9.6 oz (100.5 kg)  10/03/20 228 lb 3.2 oz (103.5 kg)   09/10/20 231 lb 9.6 oz (105.1 kg)     ASSESSMENT & PLAN:   1. Mitral valve disease with restricted motion, moderate mitral regurgitation, also tricuspid regurgitation - felt to be stable by last echocardiogram 10/2020. She was started on Lasix in 08/2020 intended to be 40mg  daily but she only took it every other day for a week then self-reduced it to once weekly and has felt good on this regimen so far. F/u echo 10/2020 showed improved pressures and BNP was totally normal at her ED visit. She did not tolerate addition of BB due to increased SOB/cough, possibly exacerbation of pulmonary disease, so was started on diltiazem recently. We will consolidate this  to long-acting form 120mg  daily. She doesn't feel her best due to continued GI side effects, some dizziness and weight loss in the context of her thyroid disease being closely managed by Dr. . We did discuss cardiac symptoms to watch out for as Dr. Sharl Ma felt she would possibly require mitral valve repair via minithoracotomy in the next year. Will plan close follow-up again in 3 months to reassess her stability hopefully following the stabilization of her Graves' disease. Will also route this note to Dr. Delton See to give her interim update of iniitation of diltiazem.  2. Dyspnea on exertion - improved with adjustments in pulmonary regimen as well as addition of Lasix. BNP normalized in December. Will continue Lasix just once weekly for now but did tell patient she can take an extra dose weekly if needed if she experiences worsening dyspnea or swelling. She is aware to alert January for any of these symptoms.  Sarcoidosis diagnosis also noted, followed closely by pulmonology, without any history of VT.  3. Hyperthyroidism - She has f/u thyroid labs tomorrow with Dr. [place] per her report. Given the ongoing weekly Lasix use will have our office contact their office to see if they can tack on a BMET at that time to avoid double stick.  4. HLD - she did not  yet start red yeast rice as she had trouble finding it initially. She will try again and we can revisit lab testing when she sees Sharl Ma back in 3 months.  Disposition: F/u with Dr. [place] in 3-4 months.  Medication Adjustments/Labs and Tests Ordered: Current medicines are reviewed at length with the patient today.  Concerns regarding medicines are outlined above. Medication changes, Labs and Tests ordered today are summarized above and listed in the Patient Instructions accessible in Encounters.   Signed, Delton See, PA-C  11/21/2020 11:32 AM    [facility] [address] [place], [place], [place]  [place] Phone: [phone]; Fax: [phone]

## 2020-11-21 ENCOUNTER — Ambulatory Visit: Payer: Medicare Other | Admitting: Physician Assistant

## 2020-11-21 ENCOUNTER — Other Ambulatory Visit: Payer: Self-pay

## 2020-11-21 ENCOUNTER — Encounter: Payer: Self-pay | Admitting: Physician Assistant

## 2020-11-21 VITALS — BP 118/68 | HR 92 | Ht 65.0 in | Wt 221.6 lb

## 2020-11-21 DIAGNOSIS — E785 Hyperlipidemia, unspecified: Secondary | ICD-10-CM

## 2020-11-21 DIAGNOSIS — R0609 Other forms of dyspnea: Secondary | ICD-10-CM

## 2020-11-21 DIAGNOSIS — I071 Rheumatic tricuspid insufficiency: Secondary | ICD-10-CM | POA: Diagnosis not present

## 2020-11-21 DIAGNOSIS — E0590 Thyrotoxicosis, unspecified without thyrotoxic crisis or storm: Secondary | ICD-10-CM | POA: Diagnosis not present

## 2020-11-21 DIAGNOSIS — I059 Rheumatic mitral valve disease, unspecified: Secondary | ICD-10-CM | POA: Diagnosis not present

## 2020-11-21 DIAGNOSIS — R0600 Dyspnea, unspecified: Secondary | ICD-10-CM | POA: Diagnosis not present

## 2020-11-21 MED ORDER — DILTIAZEM HCL ER COATED BEADS 120 MG PO CP24: 120.0000 mg | ORAL_CAPSULE | Freq: Every day | ORAL | 3 refills | Status: DC

## 2020-11-21 NOTE — Patient Instructions (Addendum)
Medication Instructions:  Your physician has recommended you make the following change in your medication:  1.  CHANGE the Lasix to taking 1 time a week 2.  STOP Diltiazem 30 mg  3.  START Diltiazem 120 mg taking 1 tablet day    *If you need a refill on your cardiac medications before your next appointment, please call your pharmacy*   Lab Work: None ordered  If you have labs (blood work) drawn today and your tests are completely normal, you will receive your results only by: Marland Kitchen MyChart Message (if you have MyChart) OR . A paper copy in the mail If you have any lab test that is abnormal or we need to change your treatment, we will call you to review the results.   Testing/Procedures: None ordered   Follow-Up: At [facility], you and your health needs are our priority.  As part of our continuing mission to provide you with exceptional heart care, we have created designated Provider Care Teams.  These Care Teams include your primary Cardiologist (physician) and Advanced Practice Providers (APPs -  Physician Assistants and Nurse Practitioners) who all work together to provide you with the care you need, when you need it.  We recommend signing up for the patient portal called "MyChart".  Sign up information is provided on this After Visit Summary.  MyChart is used to connect with patients for Virtual Visits (Telemedicine).  Patients are able to view lab/test results, encounter notes, upcoming appointments, etc.  Non-urgent messages can be sent to your provider as well.   To learn more about what you can do with MyChart, go to [email or web address].    Your next appointment:   3 month(s)  The format for your next appointment:   In Person  Provider:   You may see Tobias Alexander, MD or one of the following Advanced Practice Providers on your designated Care Team:    Ronie Spies, PA-C  Jacolyn Reedy, PA-C    Other Instructions

## 2020-11-22 ENCOUNTER — Ambulatory Visit: Payer: Medicare Other | Admitting: Emergency Medicine

## 2020-11-24 LAB — BASIC METABOLIC PANEL
BUN/Creatinine Ratio: 18 (ref 12–28)
BUN: 18 mg/dL (ref 8–27)
CO2: 23 mmol/L (ref 20–29)
Calcium: 9.6 mg/dL (ref 8.7–10.3)
Chloride: 106 mmol/L (ref 96–106)
Creatinine, Ser: 1 mg/dL (ref 0.57–1.00)
GFR calc Af Amer: 68 mL/min/{1.73_m2} (ref >59)
GFR calc non Af Amer: 59 mL/min/{1.73_m2} — ABNORMAL LOW (ref >59)
Glucose: 109 mg/dL — ABNORMAL HIGH (ref 65–99)
Potassium: 4.3 mmol/L (ref 3.5–5.2)
Sodium: 143 mmol/L (ref 134–144)

## 2020-12-05 ENCOUNTER — Ambulatory Visit: Payer: Self-pay | Admitting: Internal Medicine

## 2020-12-17 ENCOUNTER — Other Ambulatory Visit: Payer: Self-pay

## 2020-12-17 ENCOUNTER — Encounter: Payer: Self-pay | Admitting: Emergency Medicine

## 2020-12-17 ENCOUNTER — Ambulatory Visit (INDEPENDENT_AMBULATORY_CARE_PROVIDER_SITE_OTHER): Payer: Medicare Other | Admitting: Emergency Medicine

## 2020-12-17 ENCOUNTER — Ambulatory Visit: Payer: Medicare Other | Admitting: Emergency Medicine

## 2020-12-17 DIAGNOSIS — D869 Sarcoidosis, unspecified: Secondary | ICD-10-CM | POA: Diagnosis not present

## 2020-12-17 DIAGNOSIS — J449 Chronic obstructive pulmonary disease, unspecified: Secondary | ICD-10-CM

## 2020-12-17 DIAGNOSIS — K219 Gastro-esophageal reflux disease without esophagitis: Secondary | ICD-10-CM | POA: Insufficient documentation

## 2020-12-17 LAB — PULMONARY FUNCTION TEST
DL/VA % pred: 102 %
DL/VA: 4.21 ml/min/mmHg/L
DLCO cor % pred: 81 %
DLCO cor: 16.98 ml/min/mmHg
DLCO unc % pred: 76 %
DLCO unc: 15.84 ml/min/mmHg
FEF 25-75 Post: 1.35 L/sec
FEF 25-75 Pre: 0.59 L/sec
FEF2575-%Change-Post: 129 %
FEF2575-%Pred-Post: 69 %
FEF2575-%Pred-Pre: 30 %
FEV1-%Change-Post: 30 %
FEV1-%Pred-Post: 72 %
FEV1-%Pred-Pre: 55 %
FEV1-Post: 1.5 L
FEV1-Pre: 1.15 L
FEV1FVC-%Change-Post: 5 %
FEV1FVC-%Pred-Pre: 79 %
FEV6-%Change-Post: 23 %
FEV6-%Pred-Post: 88 %
FEV6-%Pred-Pre: 71 %
FEV6-Post: 2.27 L
FEV6-Pre: 1.84 L
FEV6FVC-%Change-Post: 0 %
FEV6FVC-%Pred-Post: 102 %
FEV6FVC-%Pred-Pre: 103 %
FVC-%Change-Post: 24 %
FVC-%Pred-Post: 86 %
FVC-%Pred-Pre: 69 %
FVC-Post: 2.29 L
FVC-Pre: 1.85 L
Post FEV1/FVC ratio: 66 %
Post FEV6/FVC ratio: 99 %
Pre FEV1/FVC ratio: 62 %
Pre FEV6/FVC Ratio: 100 %
RV % pred: 125 %
RV: 2.74 L
TLC % pred: 87 %
TLC: 4.62 L

## 2020-12-17 MED ORDER — BREZTRI AEROSPHERE 160-9-4.8 MCG/ACT IN AERO: 2.0000 | INHALATION_SPRAY | Freq: Two times a day (BID) | RESPIRATORY_TRACT | 0 refills | Status: DC

## 2020-12-17 NOTE — Progress Notes (Signed)
Subjective:    Patient ID: Diana Calhoun, female    DOB: 1956-07-18, 65 y.o.   MRN: [id number]  HPI 65 year old woman with history of obesity, hyperlipidemia, GERD, mitral regurgitation.  She carries a diagnosis of pulmonary and cardiac sarcoidosis that was made by mediastinoscopy in [place] after she was found to have LAD and infiltrates ~28 yrs ago. She has had rash, particularly on her back.  She had a sarcoid pericardial effusion that required window back in [place]. Has been on prolonged steroids before but not lately. No tapers in 2-3 years.  She is feeling bad - she has noticed sluggishness, hot / flushed, some poor sleep, increased LE edema. Her PCP is evaluating thyroid disease. She has some visual changes over about 2 months. Her breathing has been stable. Minimal cough. The rash is on her back, itches.  She was formally on Breo, ran out last week. Misses it when off.  Uses albuterol about 2x a day since off Breo.  She saw optho about 9 months ago here locally.   Echocardiogram 05/31/2020 shows intact LVEF, grade 2 diastolic dysfunction, mitral valve posterior leaflet calcification and prolapse with severe MR, normal right ventricular size and function and normal PASP.  TEE 06/11/20 confirmed intact RV function and normal pulmonary pressures, reassuring mitral valve function  She is COVID vaccinated. Needs the flu shot this fall. She has never had PNA shot.   Acute OV 10/03/20 --this is acute visit for 65 year old woman with significant mediastinal lymphadenopathy and cardiac involvement due to sarcoidosis.  Also with a history of obesity, hyperlipidemia, GERD, mitral regurgitation.  At our last visit we restarted Breo as she felt that she had been missing it, having more dyspnea without it.  She returns today reporting that she has had UA irritation, throat clearing, non-productive cough. Her breathing did improve some when Breo was restarted. The cough came about 2 weeks after. She is starting to  have some nasal congestion - started flonase yesterday. She is having a lot of reflux - seems to be worse since 3 weeks ago, ? Dietary indiscretion.   Repeat CT scan chest done 09/12/2020 reviewed by me, shows bulky by hilar, subcarinal and paratracheal lymphadenopathy, left also enlarged but less so than the right.  Groundglass left basilar nodule 9 x 8 mm.  There is no septal thickening, bronchiectasis or ILD.  ROV 12/17/20 --65 year old woman never smoker with a history of sarcoidosis with cardiac involvement, significant mediastinal lymphadenopathy.  Also with a history of GERD, chronic rhinitis, hyperlipidemia, MR.  She has been dealing with chronic cough that has been most consistent with upper airway irritation.  It was exacerbated somewhat by Virgel Bouquet - she is now back on this.  She been treated with pantoprazole, fluticasone nasal spray, Singulair. She was just dx with hyperthyroidism since last time, is on methimazole and is dealing with constipation.  She c/o mid sternal pain, worse w eating, on pantoprazole.   She has some intermittent dyspnea even at rest. Rarely uses albuterol due to tremor. She has benefited from being in the [place].   She underwent pulmonary function testing today which I have reviewed, shows severe obstruction with a positive bronchodilator response, normal lung volumes and normal diffusion capacity when corrected for alveolar volume.   Review of Systems As per HPI      Objective:   Physical Exam  Vitals:   12/17/20 1211  BP: (!) 142/72  Pulse: (!) 107  Temp: 98 F (36.7 C)  SpO2: 97%  Weight: 222 lb 3.2 oz (100.8 kg)  Height: 5' 5.5" (1.664 m)   Gen: Pleasant, overwt woman, in no distress,  normal affect  ENT: No lesions,  mouth clear,  oropharynx clear, no postnasal drip  Neck: No JVD, no stridor  Lungs: No use of accessory muscles, no crackles or wheezing on normal respiration, no wheeze on forced expiration  Cardiovascular: RRR, heart sounds normal,  no murmur or gallops, no peripheral edema  Musculoskeletal: No deformities, no cyanosis or clubbing  Neuro: alert, awake, non focal  Skin: Warm, no rash     Assessment & Plan:  GERD (gastroesophageal reflux disease) She is complaining of chest discomfort, sounds like flaring GERD.  We have had to increase her pantoprazole in the past.  She is planning to go see a gastroenterologist.  I will temporarily increase her pantoprazole to 40 mg twice daily for 2 weeks  Asthma with COPD (HCC) In the setting of sarcoidosis.  Obstruction with a positive bronchodilator response confirmed on PFT today.  She has been on Breo, feels that she gets benefit but still has dyspnea.  Will temporarily try Breztri to see if she gets more benefit.  If so then we will try to continue either this or Trelegy whichever is on her formulary.  Sarcoidosis Plan to repeat CT chest in October 2022.  She will continue to follow with Dr. Delton See with cardiology.  Levy Pupa, MD, PhD 12/17/2020, 12:35 PM [facility] [phone] or if no answer [phone]

## 2020-12-17 NOTE — Progress Notes (Signed)
PFT done today. 

## 2020-12-17 NOTE — Assessment & Plan Note (Signed)
In the setting of sarcoidosis.  Obstruction with a positive bronchodilator response confirmed on PFT today.  She has been on Breo, feels that she gets benefit but still has dyspnea.  Will temporarily try Breztri to see if she gets more benefit.  If so then we will try to continue either this or Trelegy whichever is on her formulary.

## 2020-12-17 NOTE — Addendum Note (Signed)
Addended by: Dorisann Frames R on: 12/17/2020 01:25 PM   Modules accepted: Orders

## 2020-12-17 NOTE — Addendum Note (Signed)
Addended by: Maurene Capes on: 12/17/2020 12:43 PM   Modules accepted: Orders

## 2020-12-17 NOTE — Patient Instructions (Addendum)
We will temporarily stop Breo. Start Breztri 2 puffs twice a day.  Remember to rinse and gargle after using.  Keep track of whether the new inhaler is helpful.  If so we may decide to change to it long-term. Keep albuterol available to use 2 puffs if needed for shortness of breath, chest tightness, wheezing. We will plan to repeat your CT scan of the chest to follow your sarcoidosis in October 2022. Temporarily increase your pantoprazole 40 mg twice a day for the next 2 weeks.  Take this medication 1 hour around food. Follow with Dr. Delton Coombes in 2 months or sooner if you have any problems.

## 2020-12-17 NOTE — Assessment & Plan Note (Signed)
Plan to repeat CT chest in October 2022.  She will continue to follow with Dr. Delton See with cardiology.

## 2020-12-17 NOTE — Assessment & Plan Note (Signed)
She is complaining of chest discomfort, sounds like flaring GERD.  We have had to increase her pantoprazole in the past.  She is planning to go see a gastroenterologist.  I will temporarily increase her pantoprazole to 40 mg twice daily for 2 weeks

## 2020-12-19 DIAGNOSIS — E0590 Thyrotoxicosis, unspecified without thyrotoxic crisis or storm: Secondary | ICD-10-CM | POA: Diagnosis not present

## 2021-01-09 DIAGNOSIS — H2513 Age-related nuclear cataract, bilateral: Secondary | ICD-10-CM | POA: Diagnosis not present

## 2021-01-09 DIAGNOSIS — H524 Presbyopia: Secondary | ICD-10-CM | POA: Diagnosis not present

## 2021-01-09 DIAGNOSIS — H02402 Unspecified ptosis of left eyelid: Secondary | ICD-10-CM | POA: Diagnosis not present

## 2021-01-09 DIAGNOSIS — H43813 Vitreous degeneration, bilateral: Secondary | ICD-10-CM | POA: Diagnosis not present

## 2021-01-16 DIAGNOSIS — E0590 Thyrotoxicosis, unspecified without thyrotoxic crisis or storm: Secondary | ICD-10-CM | POA: Diagnosis not present

## 2021-01-23 ENCOUNTER — Telehealth: Payer: Self-pay | Admitting: Cardiology

## 2021-01-23 ENCOUNTER — Other Ambulatory Visit: Payer: Self-pay

## 2021-01-23 ENCOUNTER — Encounter: Payer: Self-pay | Admitting: Cardiology

## 2021-01-23 ENCOUNTER — Ambulatory Visit (INDEPENDENT_AMBULATORY_CARE_PROVIDER_SITE_OTHER): Payer: Medicare Other | Admitting: Cardiology

## 2021-01-23 VITALS — BP 126/74 | HR 96 | Ht 65.5 in | Wt 221.2 lb

## 2021-01-23 DIAGNOSIS — I059 Rheumatic mitral valve disease, unspecified: Secondary | ICD-10-CM

## 2021-01-23 DIAGNOSIS — E785 Hyperlipidemia, unspecified: Secondary | ICD-10-CM

## 2021-01-23 DIAGNOSIS — D869 Sarcoidosis, unspecified: Secondary | ICD-10-CM | POA: Diagnosis not present

## 2021-01-23 DIAGNOSIS — I309 Acute pericarditis, unspecified: Secondary | ICD-10-CM | POA: Diagnosis not present

## 2021-01-23 MED ORDER — COLCHICINE 0.6 MG PO TABS: 0.6000 mg | ORAL_TABLET | Freq: Two times a day (BID) | ORAL | 0 refills | Status: DC

## 2021-01-23 MED ORDER — DILTIAZEM HCL ER COATED BEADS 120 MG PO CP24: 120.0000 mg | ORAL_CAPSULE | Freq: Every day | ORAL | 1 refills | Status: DC

## 2021-01-23 MED ORDER — COLCHICINE 0.6 MG PO CAPS: 0.6000 mg | ORAL_CAPSULE | Freq: Two times a day (BID) | ORAL | 0 refills | Status: DC

## 2021-01-23 NOTE — Telephone Encounter (Signed)
Pt c/o medication issue:  1. Name of Medication: colchicine 0.6 MG tablet  2. How are you currently taking this medication (dosage and times per day)? N/a   3. Are you having a reaction (difficulty breathing--STAT)? N/a   4. What is your medication issue?   Patient saw Dr. Delton See today at 9:20 AM. She states she was advised to contact the office after picking up this medication. She recently picked it up from her local pharmacy and she would like to discuss with Dr. Lindaann Slough nurse before she takes it.

## 2021-01-23 NOTE — Telephone Encounter (Signed)
**Note De-Identified Diana Calhoun Obfuscation** I changed the pts Colchicine RX to [employer] to see if ins will cover but was advised by Karin Golden that they cannot check the cost because the pt picked her Colchicine RX up today so its too soon to run it to check the cost. She paid $47/30 day supply per [employer].  I was advised that they will run the RX as [employer] on next refill to see if that is less expensive for the pt.

## 2021-01-23 NOTE — Telephone Encounter (Signed)
Pt is calling for 2 reasons.  She saw Dr. Delton See in the clinic today and was prescribed colchicine 0.6 mg po bid for 3 months. Pt is calling about the cost of this med and to also run by Dr. Delton See if it's absolutely necessary for her to continue taking prescribed diltiazem CD 120 mg po daily, for she prefers not being on this regimen, and it can interact with taking colchicine.   Firstly, pt states she went to pick up her colchicine today after seeing her in clinic, and the cost of this medication will be $141 for the 3 month supply.  Pt is wanting to know if there can be a prior auth done on this medication to lower the cost.  Informed the pt that I will route this message to our Prior Auth Nurse Larita Fife, to see if she could submit a PA on this medication, and help assist with getting the cost of this down, through her insurance carrier.    Secondly, pt wanted to ask Dr. Delton See if it is really necessary for her to be on diltiazem CD, for she states in interacts with the colchicine and she really doesn't feel the greatest when she takes it.  She states she prefers being off this medication, but if Dr. Delton See prefers her stay on this, she will.  She states she will continuously monitor and HR/BP, and report values as needed.  Informed the pt that I will route this concern to Dr. Delton See to further review and advise on this concern, and I will follow-up with her accordingly thereafter.  Pt verbalized understanding and agrees with this plan.

## 2021-01-23 NOTE — Progress Notes (Signed)
Cardiology Office Note    Date:  01/23/2021   ID:  Diana Carotaernett Pecha, DOB 1956/07/04, MRN [id number]  PCP:  Renford DillsPolite, Ronald, MD  Cardiologist:  Tobias AlexanderKatarina Charnell Peplinski, MD  Electrophysiologist:  None   Chief Complaint: Chest pain  History of Present Illness:   Diana Calhoun is a 65 y.o. 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 She also reportedly recently underwent calcium scoring in January of this year that showed calcium score of 0. She had a prior echo  in 2019 that showed LVEF 55%, mild-moderate MR, mild TR, mildly elevated RVSP. 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 TEE was pursued with results below showing no prolapse but restricted posterior leaflet motion with moderate-severe MR and moderate TR. She was recently seen back in October by Dr. Delton SeeNelson for follow-up of SOB. She had seen Dr. Delton CoombesByrum, pulmonology, who had started her on Breo with improvement in symptoms, pending consideration for steroid therapy for sarcoid disease. She had also been diagnosed with hyperthyroidism and was pending f/u with endocrinology. Dr. Delton SeeNelson added Lasix 40mg  daily. Repeat echo 10/17/20 was felt to be stable with EF 60-65%, grade 1 DD, mild-moderate LAE, posterior MV leaflet motion restricted with moderate MR without significant change from prior except no longer with elevated L or R atrial pressures.  Today she states that in the last months she has had recurrent positional chest pain did radiate to her right shoulder and back, this is worse at night and when she lays on her left side, she tried ibuprofen however because she has severe gastric reflux disease that made it worse and she has been taking 8 Tylenols a  day.  Her LFTs have been normal recently.  Her chest pain does not get worse with exertion but it with any motion or deep breath it gets worse.  She denies any palpitation dizziness or syncope.  She has been compliant with her meds.  Labwork independently reviewed: 10/2020 albumin 2.9, AST/ALT wnl, troponin negative, BNP 26, TSH <0.010 (had been low previously as well - was still pending endocrine appt at that time) 08/2020 pBNP 1486 05/2020 LDL 114   Past Medical History:  Diagnosis Date  . Body mass index (BMI) of 30.0-30.9 in adult   . Familial hypercholesterolemia   . GERD (gastroesophageal reflux disease)   . Hyperthyroidism   . Mitral valve regurgitation   . MVP (mitral valve prolapse)   . Pulmonary sarcoidosis (HCC)   . Tricuspid regurgitation     Past Surgical History:  Procedure Laterality Date  . CESAREAN SECTION    . PERCARDIUM INFUSION    . TEE WITHOUT CARDIOVERSION N/A 06/11/2020   Procedure: TRANSESOPHAGEAL ECHOCARDIOGRAM (TEE);  Surgeon: Wendall StadeNishan, Peter C, MD;  [facility];  Service: Cardiovascular;  Laterality: N/A;    Current Medications: Current Meds  Medication Sig  . acetaminophen (TYLENOL) 650 MG CR tablet Take 650 mg by mouth every 8 (eight) hours as needed for pain (Hip pain).  . fluticasone furoate-vilanterol (BREO ELLIPTA) 200-25 MCG/INH AEPB Inhale 1 puff into the lungs daily.  . furosemide (LASIX) 40 MG tablet Take 1 tablet (40 mg total) by mouth daily. (Patient taking differently: Take 40 mg by mouth every other day.)  . HYDROcodone-homatropine (HYCODAN) 5-1.5 MG/5ML syrup Take  5 mLs by mouth every 6 (six) hours as needed for cough.  . methimazole (TAPAZOLE) 10 MG tablet Take 2 tablets (20 mg total) by mouth daily for 10 days. (Patient taking differently: Take 1 tablet (10 mg) by mouth 3 (three) times daily.)  . montelukast (SINGULAIR) 10 MG tablet Take 10 mg by mouth at bedtime as needed (allergies).   . pantoprazole (PROTONIX) 40 MG tablet  Take 1 tablet (40 mg total) by mouth daily.  . Selenium 100 MCG CAPS Take 2 capsules by mouth daily.      Allergies:   Shellfish allergy and Sulfa antibiotics   Social History   Socioeconomic History  . Marital status: Single    Spouse name: Not on file  . Number of children: Not on file  . Years of education: Not on file  . Highest education level: Not on file  Occupational History  . Not on file  Tobacco Use  . Smoking status: Never Smoker  . Smokeless tobacco: Never Used  Substance and Sexual Activity  . Alcohol use: Not on file  . Drug use: Not on file  . Sexual activity: Not on file  Other Topics Concern  . Not on file  Social History Narrative  . Not on file   Social Determinants of Health   Financial Resource Strain: Not on file  Food Insecurity: Not on file  Transportation Needs: Not on file  Physical Activity: Not on file  Stress: Not on file  Social Connections: Not on file     Family History:  The patient's family history includes Diabetes in her sister and sister; Hypertension in her mother.  ROS:   Please see the history of present illness. Occasional mild edema. All other systems are reviewed and otherwise negative.    EKGs/Labs/Other Studies Reviewed:    Studies reviewed are outlined and summarized above. Reports included below if pertinent.  10/17/20 1. Left ventricular ejection fraction, by estimation, is 60 to 65%. The  left ventricle has normal function. The left ventricle has no regional  wall motion abnormalities. Left ventricular diastolic parameters are  consistent with Grade I diastolic  dysfunction (impaired relaxation).  2. Right ventricular systolic function is normal. The right ventricular  size is normal. Tricuspid regurgitation signal is inadequate for assessing  PA pressure.  3. Left atrial size was mild to moderately dilated.  4. Mitral valve changes suggest rheumatic or other postinflammatory  change. The posterior  leaflet motion is restricted. The mitral  regurgitation jet is very eccentric and its severity may be  underestimated. Moderate mitral valve regurgitation. No  evidence of mitral stenosis.  5. The aortic valve is normal in structure. Aortic valve regurgitation is  not visualized. No aortic stenosis is present.  6. The inferior vena cava is normal in size with greater than 50%  respiratory variability, suggesting right atrial pressure of 3 mmHg.   Comparison(s): Prior images reviewed side by side. There is no significant  change in left ventricular systolic function or mitral valve findings, but  there is no longer evidence of elevated left or right atrial pressures.   06/11/20 TEE IMPRESSIONS    1. Left ventricular ejection fraction, by estimation, is 60 to 65%. The  left ventricle has normal function. The left ventricle has no regional  wall motion abnormalities.  2. Right ventricular systolic function is normal. The right ventricular  size is normal. There is normal pulmonary artery systolic pressure.  3. Left atrial size was moderately dilated. No left  atrial/left atrial  appendage thrombus was detected.  4. No prolapse. Carpentier type 3A restricted posterior leaflet motion  Pisa Radius 0.8 , ERO .39 and RV 55 suggest moderate to severe disease. 3D  VC 0.87 suggests worse but there appears to be two MR jets one central and  one lateral which makes  assessing 3D VC difficult There was blunted systolic forward flow in the  PV;s but clearly no reversal . The mitral valve is abnormal. Moderate to  severe mitral valve regurgitation. No evidence of mitral stenosis.  5. Tricuspid valve regurgitation is moderate.  6. The aortic valve is tricuspid. Aortic valve regurgitation is not  visualized. No aortic stenosis is present.  7. The inferior vena cava is normal in size with greater than 50%  respiratory variability, suggesting right atrial pressure of 3 mmHg.    Conclusion(s)/Recommendation(s): Normal biventricular function without  evidence of hemodynamically significant valvular heart disease.     EKG:  EKG is ordered today, personally reviewed, demonstrating NSR 92bpm without acute STT changes  Recent Labs: 09/10/2020: NT-Pro BNP 1,486 10/19/2020: Hemoglobin 11.4; Platelets 168; TSH <0.010 10/20/2020: ALT 33; B Natriuretic Peptide 26.0 11/21/2020: BUN 18; Creatinine, Ser 1.00; Potassium 4.3; Sodium 143  Recent Lipid Panel    Component Value Date/Time   CHOL 181 05/31/2020 0818   TRIG 63 05/31/2020 0818   HDL 55 05/31/2020 0818   CHOLHDL 3.3 05/31/2020 0818   LDLCALC 114 (H) 05/31/2020 0818    PHYSICAL EXAM:    VS:  BP 126/74   Pulse 96   Ht 5' 5.5" (1.664 m)   Wt 221 lb 3.2 oz (100.3 kg)   SpO2 98%   BMI 36.25 kg/m   BMI: Body mass index is 36.25 kg/m.  GEN: Well nourished, well developed AAF, in no acute distress HEENT: normocephalic, atraumatic Neck: no JVD, carotid bruits, or masses Cardiac: RRR; very soft SEM LSB, no rubs or gallops, no edema  Respiratory:  clear to auscultation bilaterally, normal work of breathing GI: soft, nontender, nondistended, + BS MS: no deformity or atrophy Skin: warm and dry, no rash Neuro:  Alert and Oriented x 3, Strength and sensation are intact, follows commands Psych: euthymic mood, full affect  Wt Readings from Last 3 Encounters:  01/23/21 221 lb 3.2 oz (100.3 kg)  12/17/20 222 lb 3.2 oz (100.8 kg)  11/21/20 221 lb 9.6 oz (100.5 kg)     ASSESSMENT & PLAN:   1.  Recurrent acute pericarditis, with history of pericardial effusion and pericardial window placement, she cannot take NSAIDs because of significant reflux, will start colchicine 0.6 mg p.o. twice daily, we will follow her in 4 weeks and if her symptoms are not improved we will consider Arcalyst (rilonacept), the fact that this is recurrent pericarditis.  2. Mitral valve disease with restricted motion, moderate mitral  regurgitation, also tricuspid regurgitation - felt to be stable by last echocardiogram 10/2020. She was started on Lasix in 08/2020, that her symptoms were well.  She appears euvolemic.   3. Dyspnea on exertion - improved with adjustments in pulmonary regimen as well as addition of Lasix.   4. Hyperthyroidism - She has f/u thyroid labs tomorrow with Dr. Sharl Ma per her report. Given the ongoing weekly Lasix use will have our office contact their office to see if they can tack on a BMET at that time to avoid double stick.  5. HLD - she did not yet start red yeast rice as she had trouble finding it initially. She  will try again and we can revisit lab testing when she sees [place] back in 3 months.  Disposition: F/u with Dr. Shari Prows in 4 weeks..  Medication Adjustments/Labs and Tests Ordered: Current medicines are reviewed at length with the patient today.  Concerns regarding medicines are outlined above. Medication changes, Labs and Tests ordered today are summarized above and listed in the Patient Instructions accessible in Encounters.   Signed, Tobias Alexander, MD  01/23/2021 11:01 AM    [facility] [address] [place], [place], [place]  [address] Phone: [phone]; Fax: [phone]

## 2021-01-23 NOTE — Patient Instructions (Signed)
Medication Instructions:   START TAKING COLCHICINE 0.6 MG BY MOUTH TWICE DAILY WITH FOOD--TAKE FOR 3 MONTHS ONLY  *If you need a refill on your cardiac medications before your next appointment, please call your pharmacy*   Follow-Up:  4 WEEKS IN THE OFFICE TO ESTABLISH AND SEE DR. Shari Prows

## 2021-01-24 NOTE — Addendum Note (Signed)
Addended by: Loa Socks on: 01/24/2021 02:16 PM   Modules accepted: Orders

## 2021-01-24 NOTE — Telephone Encounter (Signed)
Have her hold cardizem, but monitor her BP when she takes it!

## 2021-01-24 NOTE — Telephone Encounter (Signed)
Spoke with the pt, and informed her that per Dr. Delton See, she can stop her cardizem, but monitor her BP when she takes it.  Also informed the pt that per Larita Fife Via our Prior Auth Nurse, she changed the colchicine rx to [employer] to see if Karin Golden can fill this script at a reduced cost, next time she goes to get this refilled.  Advised her to call us if there is an issue.  Pt verbalized understanding and agrees with this plan. Pt was more than gracious for all the assistance provided.  Will go ahead and discontinue her cardizem from her current med list.

## 2021-01-30 ENCOUNTER — Telehealth: Payer: Self-pay | Admitting: Emergency Medicine

## 2021-01-30 MED ORDER — BREO ELLIPTA 200-25 MCG/INH IN AEPB: 1.0000 | INHALATION_SPRAY | Freq: Every day | RESPIRATORY_TRACT | 5 refills | Status: DC

## 2021-01-30 MED ORDER — BREO ELLIPTA 200-25 MCG/INH IN AEPB
1.0000 | INHALATION_SPRAY | Freq: Every day | RESPIRATORY_TRACT | 5 refills | Status: DC
Start: 2021-01-30 — End: 2021-01-30

## 2021-01-30 NOTE — Telephone Encounter (Signed)
Called and spoke with pt letting her know that RB said he was fine with her going back on breo and she verbalized understanding. Verified preferred pharmacy and sent Rx in for pt. Nothing further needed.

## 2021-01-30 NOTE — Telephone Encounter (Signed)
I am ok with trying to go back on breo to see how it goes

## 2021-01-30 NOTE — Telephone Encounter (Signed)
Spoke with pt, states that she wants to switch back to [place].  Pt has been on Breztri since 1/31 OV and is noticing increased SOB between inhaler usage.  States she was doing well on Breo and wishes to go back to this.  [facility]: [facility]  RB ok to switch back to [place]? Thanks!

## 2021-02-06 DIAGNOSIS — D869 Sarcoidosis, unspecified: Secondary | ICD-10-CM | POA: Diagnosis not present

## 2021-02-06 DIAGNOSIS — E0590 Thyrotoxicosis, unspecified without thyrotoxic crisis or storm: Secondary | ICD-10-CM | POA: Diagnosis not present

## 2021-02-06 DIAGNOSIS — R0789 Other chest pain: Secondary | ICD-10-CM | POA: Diagnosis not present

## 2021-02-21 DIAGNOSIS — M2550 Pain in unspecified joint: Secondary | ICD-10-CM | POA: Diagnosis not present

## 2021-02-21 DIAGNOSIS — R1310 Dysphagia, unspecified: Secondary | ICD-10-CM | POA: Diagnosis not present

## 2021-02-21 DIAGNOSIS — M79643 Pain in unspecified hand: Secondary | ICD-10-CM | POA: Diagnosis not present

## 2021-02-22 ENCOUNTER — Other Ambulatory Visit: Payer: Self-pay | Admitting: Internal Medicine

## 2021-02-22 DIAGNOSIS — R1310 Dysphagia, unspecified: Secondary | ICD-10-CM

## 2021-02-23 NOTE — Progress Notes (Deleted)
Cardiology Office Note:    Date:  02/23/2021   ID:  [facility], DOB 06-Dec-1955, MRN [id number]  PCP:  Renford Dills, MD   [facility]  Cardiologist:  Tobias Alexander, MD  Advanced Practice Provider:  No care team member to display Electrophysiologist:  None   Referring MD: Renford Dills, MD    History of Present Illness:    Diana [facility] is a 65 y.o. female with a hx of sarcoidosis, mitral valve prolapse, mitral regurgitation, pericardial effusion s/p prior pericardial window ~20 years ago, GERD, HLD, hyperthyroidism who was previously followed by Dr. Delton See now returning to clinic for follow-up.  Patient moved here from [place]. 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 Also underwent calcium scoring in 11/2019 that showed calcium score of 0. She had a prior echo in 2019 that showed LVEF 55%, mild-moderate MR, mild TR, mildly elevated RVSP. 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 TEE was pursued with results below showing no prolapse but restricted posterior leaflet motion with moderate-severe MR and moderate TR. Repeat echo 10/17/20 was felt to be stable with EF 60-65%, grade 1 DD, mild-moderate LAE, posterior MV leaflet motion restricted with moderate MR.  Last saw Dr. Delton See on 01/23/21 where she was having positional chest pain which was not exertional related with concern for recurrent pericarditis. She was started on colchicine and now returns to clinic for follow-up.   Past Medical History:  Diagnosis Date  . Body mass index (BMI) of 30.0-30.9 in adult   . Familial hypercholesterolemia   . GERD (gastroesophageal reflux disease)   . Hyperthyroidism   . Mitral valve regurgitation   . MVP (mitral valve prolapse)   . Pulmonary sarcoidosis (HCC)   . Tricuspid regurgitation     Past Surgical History:  Procedure Laterality Date  . CESAREAN SECTION    .  PERCARDIUM INFUSION    . TEE WITHOUT CARDIOVERSION N/A 06/11/2020   Procedure: TRANSESOPHAGEAL ECHOCARDIOGRAM (TEE);  Surgeon: Wendall Stade, MD;  Location: [facility] ENDOSCOPY;  Service: Cardiovascular;  Laterality: N/A;    Current Medications: No outpatient medications have been marked as taking for the 03/01/21 encounter (Appointment) with Meriam Sprague, MD.     Allergies:   Shellfish allergy and Sulfa antibiotics   Social History   Socioeconomic History  . Marital status: Single    Spouse name: Not on file  . Number of children: Not on file  . Years of education: Not on file  . Highest education level: Not on file  Occupational History  . Not on file  Tobacco Use  . Smoking status: Never Smoker  . Smokeless tobacco: Never Used  Substance and Sexual Activity  . Alcohol use: Not on file  . Drug use: Not on file  . Sexual activity: Not on file  Other Topics Concern  . Not on file  Social History Narrative  . Not on file   Social Determinants of Health   Financial Resource Strain: Not on file  Food Insecurity: Not on file  Transportation Needs: Not on file  Physical Activity: Not on file  Stress: Not on file  Social Connections: Not on file     Family History: The patient's ***family history includes Diabetes in her sister and sister; Hypertension in her mother.  ROS:   Please see the history of present illness.    *** All other systems reviewed and are negative.  EKGs/Labs/Other Studies Reviewed:    The following studies were reviewed today: 10/17/20 1. Left ventricular ejection fraction, by estimation, is 60 to 65%. The  left ventricle has normal function. The left ventricle has no regional  wall motion abnormalities. Left ventricular diastolic parameters are  consistent with Grade I diastolic  dysfunction (impaired relaxation).  2. Right ventricular systolic function is normal. The right ventricular  size is normal. Tricuspid regurgitation signal is  inadequate for assessing  PA pressure.  3. Left atrial size was mild to moderately dilated.  4. Mitral valve changes suggest rheumatic or other postinflammatory  change. The posterior leaflet motion is restricted. The mitral  regurgitation jet is very eccentric and its severity may be  underestimated. Moderate mitral valve regurgitation. No  evidence of mitral stenosis.  5. The aortic valve is normal in structure. Aortic valve regurgitation is  not visualized. No aortic stenosis is present.  6. The inferior vena cava is normal in size with greater than 50%  respiratory variability, suggesting right atrial pressure of 3 mmHg.   Comparison(s): Prior images reviewed side by side. There is no significant  change in left ventricular systolic function or mitral valve findings, but  there is no longer evidence of elevated left or right atrial pressures.   06/11/20 TEE IMPRESSIONS    1. Left ventricular ejection fraction, by estimation, is 60 to 65%. The  left ventricle has normal function. The left ventricle has no regional  wall motion abnormalities.  2. Right ventricular systolic function is normal. The right ventricular  size is normal. There is normal pulmonary artery systolic pressure.  3. Left atrial size was moderately dilated. No left atrial/left atrial  appendage thrombus was detected.  4. No prolapse. Carpentier type 3A restricted posterior leaflet motion  Pisa Radius 0.8 , ERO .39 and RV 55 suggest moderate to severe disease. 3D  VC 0.87 suggests worse but there appears to be two MR jets one central and  one lateral which makes  assessing 3D VC difficult There was blunted systolic forward flow in the  PV;s but clearly no reversal . The mitral valve is abnormal. Moderate to  severe mitral valve regurgitation. No evidence of mitral stenosis.  5. Tricuspid valve regurgitation is moderate.  6. The aortic valve is tricuspid. Aortic valve regurgitation is not  visualized.  No aortic stenosis is present.  7. The inferior vena cava is normal in size with greater than 50%  respiratory variability, suggesting right atrial pressure of 3 mmHg.   Conclusion(s)/Recommendation(s): Normal biventricular function without  evidence of hemodynamically significant valvular heart disease.   EKG:  EKG is *** ordered today.  The ekg ordered today demonstrates ***  Recent Labs: 09/10/2020: NT-Pro BNP 1,486 10/19/2020: Hemoglobin 11.4; Platelets 168; TSH <0.010 10/20/2020: ALT 33; B Natriuretic Peptide 26.0 11/21/2020: BUN 18; Creatinine, Ser 1.00; Potassium 4.3; Sodium 143  Recent Lipid Panel    Component Value Date/Time   CHOL 181 05/31/2020 0818   TRIG 63 05/31/2020 0818   HDL 55 05/31/2020 0818   CHOLHDL 3.3 05/31/2020 0818   LDLCALC 114 (H) 05/31/2020 0818     Risk Assessment/Calculations:   {Does this patient have ATRIAL FIBRILLATION?:[phone]}   Physical Exam:    VS:  There were no vitals taken for this visit.    Wt Readings from Last 3 Encounters:  01/23/21 221 lb 3.2 oz (100.3 kg)  12/17/20 222 lb 3.2 oz (100.8 kg)  11/21/20 221 lb 9.6 oz (100.5 kg)  GEN: *** Well nourished, well developed in no acute distress HEENT: Normal NECK: No JVD; No carotid bruits LYMPHATICS: No lymphadenopathy CARDIAC: ***RRR, no murmurs, rubs, gallops RESPIRATORY:  Clear to auscultation without rales, wheezing or rhonchi  ABDOMEN: Soft, non-tender, non-distended MUSCULOSKELETAL:  No edema; No deformity  SKIN: Warm and dry NEUROLOGIC:  Alert and oriented x 3 PSYCHIATRIC:  Normal affect   ASSESSMENT:    No diagnosis found. PLAN:    In order of problems listed above:  #Recurrent acute pericarditis, with history of pericardial effusion and pericardial window:  She cannot take NSAIDs because of significant reflux. Started on colchicine with improvement of symptoms. Will continue prolonged course. -Continue colchicine for 84months and then slow wean  #Mitral  valve disease with restricted motion, moderate mitral regurgitation, also tricuspid regurgitation: Stable by last echocardiogram 10/2020. Euvolemic and exam -Continue lasix 40mg  daily -Serial monitoring with TTE with next 10/2021  #Dyspnea on exertion : Improved with adjustments in pulmonary regimen as well as addition of Lasix.  -Continue lasix as above -Continue home inhalers  #Hyperthyroidism: Followed by Dr. 11/2021. -Continue methimazole  #HLD: On red yeast rice. -Repeat lipids in 6-8 weeks   {Are you ordering a CV Procedure (e.g. stress test, cath, DCCV, TEE, etc)?   Press F2        :Sharl Ma    Medication Adjustments/Labs and Tests Ordered: Current medicines are reviewed at length with the patient today.  Concerns regarding medicines are outlined above.  No orders of the defined types were placed in this encounter.  No orders of the defined types were placed in this encounter.   There are no Patient Instructions on file for this visit.   Signed, [id number]}, MD  02/23/2021 1:39 PM    [facility]

## 2021-02-27 DIAGNOSIS — E0500 Thyrotoxicosis with diffuse goiter without thyrotoxic crisis or storm: Secondary | ICD-10-CM | POA: Diagnosis not present

## 2021-02-27 DIAGNOSIS — E0590 Thyrotoxicosis, unspecified without thyrotoxic crisis or storm: Secondary | ICD-10-CM | POA: Diagnosis not present

## 2021-02-27 DIAGNOSIS — D649 Anemia, unspecified: Secondary | ICD-10-CM | POA: Diagnosis not present

## 2021-02-27 DIAGNOSIS — R21 Rash and other nonspecific skin eruption: Secondary | ICD-10-CM | POA: Diagnosis not present

## 2021-03-01 ENCOUNTER — Ambulatory Visit: Payer: Medicare Other | Admitting: Cardiology

## 2021-03-11 ENCOUNTER — Ambulatory Visit
Admission: RE | Admit: 2021-03-11 | Discharge: 2021-03-11 | Disposition: A | Discharge status: Home / Self Care | Payer: Medicare Other | Source: Ambulatory Visit | Attending: Internal Medicine | Admitting: Internal Medicine

## 2021-03-11 DIAGNOSIS — R1310 Dysphagia, unspecified: Secondary | ICD-10-CM

## 2021-03-11 DIAGNOSIS — K219 Gastro-esophageal reflux disease without esophagitis: Secondary | ICD-10-CM | POA: Diagnosis not present

## 2021-03-14 ENCOUNTER — Other Ambulatory Visit: Payer: Self-pay | Admitting: Internal Medicine

## 2021-03-14 ENCOUNTER — Other Ambulatory Visit (HOSPITAL_COMMUNITY): Payer: Self-pay | Admitting: Internal Medicine

## 2021-03-14 DIAGNOSIS — E0590 Thyrotoxicosis, unspecified without thyrotoxic crisis or storm: Secondary | ICD-10-CM

## 2021-03-14 DIAGNOSIS — E0500 Thyrotoxicosis with diffuse goiter without thyrotoxic crisis or storm: Secondary | ICD-10-CM

## 2021-03-15 ENCOUNTER — Ambulatory Visit: Payer: Medicare Other | Admitting: Emergency Medicine

## 2021-04-02 ENCOUNTER — Other Ambulatory Visit: Payer: Self-pay

## 2021-04-02 ENCOUNTER — Encounter (HOSPITAL_COMMUNITY)
Admission: RE | Admit: 2021-04-02 | Discharge: 2021-04-02 | Disposition: A | Discharge status: Home / Self Care | Payer: Medicare Other | Source: Ambulatory Visit | Attending: Internal Medicine | Admitting: Internal Medicine

## 2021-04-02 ENCOUNTER — Ambulatory Visit (HOSPITAL_COMMUNITY)
Admission: RE | Admit: 2021-04-02 | Discharge: 2021-04-02 | Disposition: A | Discharge status: Home / Self Care | Payer: Medicare Other | Source: Ambulatory Visit | Attending: Internal Medicine | Admitting: Internal Medicine

## 2021-04-02 DIAGNOSIS — E0590 Thyrotoxicosis, unspecified without thyrotoxic crisis or storm: Secondary | ICD-10-CM | POA: Insufficient documentation

## 2021-04-02 DIAGNOSIS — E0500 Thyrotoxicosis with diffuse goiter without thyrotoxic crisis or storm: Secondary | ICD-10-CM | POA: Insufficient documentation

## 2021-04-02 MED ORDER — SODIUM IODIDE I 131 CAPSULE
18.1000 | Freq: Once | INTRAVENOUS | Status: AC | PRN
  Administered 2021-04-02: 18.1 via ORAL

## 2021-04-03 ENCOUNTER — Encounter (HOSPITAL_COMMUNITY)
Admission: RE | Admit: 2021-04-03 | Discharge: 2021-04-03 | Disposition: A | Discharge status: Home / Self Care | Payer: Medicare Other | Source: Ambulatory Visit | Attending: Internal Medicine | Admitting: Internal Medicine

## 2021-04-03 DIAGNOSIS — E0500 Thyrotoxicosis with diffuse goiter without thyrotoxic crisis or storm: Secondary | ICD-10-CM | POA: Diagnosis not present

## 2021-04-03 MED ORDER — SODIUM PERTECHNETATE TC 99M INJECTION
10.2000 | Freq: Once | INTRAVENOUS | Status: AC | PRN
  Administered 2021-04-03: 10.2 via INTRAVENOUS

## 2021-04-09 ENCOUNTER — Encounter (HOSPITAL_COMMUNITY): Payer: Self-pay

## 2021-04-09 ENCOUNTER — Ambulatory Visit (HOSPITAL_COMMUNITY): Payer: Medicare Other

## 2021-04-09 ENCOUNTER — Other Ambulatory Visit (HOSPITAL_COMMUNITY): Payer: Medicare Other

## 2021-04-10 ENCOUNTER — Other Ambulatory Visit (HOSPITAL_COMMUNITY): Payer: Medicare Other

## 2021-04-16 ENCOUNTER — Other Ambulatory Visit (HOSPITAL_COMMUNITY): Payer: Self-pay | Admitting: Internal Medicine

## 2021-04-16 DIAGNOSIS — E0590 Thyrotoxicosis, unspecified without thyrotoxic crisis or storm: Secondary | ICD-10-CM

## 2021-04-18 ENCOUNTER — Ambulatory Visit: Payer: Medicare Other | Admitting: Emergency Medicine

## 2021-04-22 ENCOUNTER — Encounter (HOSPITAL_COMMUNITY)
Admission: RE | Admit: 2021-04-22 | Discharge: 2021-04-22 | Disposition: A | Discharge status: Home / Self Care | Payer: Medicare Other | Source: Ambulatory Visit | Attending: Internal Medicine | Admitting: Internal Medicine

## 2021-04-22 ENCOUNTER — Other Ambulatory Visit: Payer: Self-pay

## 2021-04-22 DIAGNOSIS — E0590 Thyrotoxicosis, unspecified without thyrotoxic crisis or storm: Secondary | ICD-10-CM | POA: Diagnosis not present

## 2021-04-22 MED ORDER — SODIUM IODIDE I 131 CAPSULE
13.0400 | Freq: Once | INTRAVENOUS | Status: AC | PRN
  Administered 2021-04-22: 13.04 via ORAL

## 2021-04-30 DIAGNOSIS — E0500 Thyrotoxicosis with diffuse goiter without thyrotoxic crisis or storm: Secondary | ICD-10-CM | POA: Diagnosis not present

## 2021-04-30 DIAGNOSIS — E0590 Thyrotoxicosis, unspecified without thyrotoxic crisis or storm: Secondary | ICD-10-CM | POA: Diagnosis not present

## 2021-04-30 DIAGNOSIS — D649 Anemia, unspecified: Secondary | ICD-10-CM | POA: Diagnosis not present

## 2021-04-30 DIAGNOSIS — R7309 Other abnormal glucose: Secondary | ICD-10-CM | POA: Diagnosis not present

## 2021-04-30 DIAGNOSIS — Z131 Encounter for screening for diabetes mellitus: Secondary | ICD-10-CM | POA: Diagnosis not present

## 2021-05-01 DIAGNOSIS — R1310 Dysphagia, unspecified: Secondary | ICD-10-CM | POA: Diagnosis not present

## 2021-05-01 DIAGNOSIS — K2100 Gastro-esophageal reflux disease with esophagitis, without bleeding: Secondary | ICD-10-CM | POA: Diagnosis not present

## 2021-05-06 ENCOUNTER — Ambulatory Visit: Payer: Medicare Other | Admitting: Cardiology

## 2021-05-15 DIAGNOSIS — E0590 Thyrotoxicosis, unspecified without thyrotoxic crisis or storm: Secondary | ICD-10-CM | POA: Diagnosis not present

## 2021-06-03 ENCOUNTER — Ambulatory Visit: Payer: Medicare Other | Admitting: Cardiology

## 2021-06-07 ENCOUNTER — Encounter: Payer: Self-pay | Admitting: Cardiology

## 2021-06-12 DIAGNOSIS — H04123 Dry eye syndrome of bilateral lacrimal glands: Secondary | ICD-10-CM | POA: Diagnosis not present

## 2021-06-12 DIAGNOSIS — H0520 Unspecified exophthalmos: Secondary | ICD-10-CM | POA: Diagnosis not present

## 2021-06-21 DIAGNOSIS — E0500 Thyrotoxicosis with diffuse goiter without thyrotoxic crisis or storm: Secondary | ICD-10-CM | POA: Diagnosis not present

## 2021-06-21 DIAGNOSIS — E0590 Thyrotoxicosis, unspecified without thyrotoxic crisis or storm: Secondary | ICD-10-CM | POA: Diagnosis not present

## 2021-07-01 ENCOUNTER — Ambulatory Visit: Payer: Medicare Other | Admitting: Cardiology

## 2021-07-24 DIAGNOSIS — E0590 Thyrotoxicosis, unspecified without thyrotoxic crisis or storm: Secondary | ICD-10-CM | POA: Diagnosis not present

## 2021-07-31 DIAGNOSIS — R002 Palpitations: Secondary | ICD-10-CM | POA: Diagnosis not present

## 2021-07-31 DIAGNOSIS — E0590 Thyrotoxicosis, unspecified without thyrotoxic crisis or storm: Secondary | ICD-10-CM | POA: Diagnosis not present

## 2021-07-31 DIAGNOSIS — D649 Anemia, unspecified: Secondary | ICD-10-CM | POA: Diagnosis not present

## 2021-07-31 DIAGNOSIS — E0500 Thyrotoxicosis with diffuse goiter without thyrotoxic crisis or storm: Secondary | ICD-10-CM | POA: Diagnosis not present

## 2021-08-05 DIAGNOSIS — R519 Headache, unspecified: Secondary | ICD-10-CM | POA: Diagnosis not present

## 2021-08-05 DIAGNOSIS — J3489 Other specified disorders of nose and nasal sinuses: Secondary | ICD-10-CM | POA: Diagnosis not present

## 2021-08-22 ENCOUNTER — Other Ambulatory Visit: Payer: Medicare Other

## 2021-08-22 DIAGNOSIS — J45909 Unspecified asthma, uncomplicated: Secondary | ICD-10-CM | POA: Diagnosis not present

## 2021-08-22 DIAGNOSIS — E0590 Thyrotoxicosis, unspecified without thyrotoxic crisis or storm: Secondary | ICD-10-CM | POA: Diagnosis not present

## 2021-08-22 DIAGNOSIS — E0500 Thyrotoxicosis with diffuse goiter without thyrotoxic crisis or storm: Secondary | ICD-10-CM | POA: Diagnosis not present

## 2021-08-22 DIAGNOSIS — R7303 Prediabetes: Secondary | ICD-10-CM | POA: Diagnosis not present

## 2021-08-22 DIAGNOSIS — Z1389 Encounter for screening for other disorder: Secondary | ICD-10-CM | POA: Diagnosis not present

## 2021-08-22 DIAGNOSIS — Z8679 Personal history of other diseases of the circulatory system: Secondary | ICD-10-CM | POA: Diagnosis not present

## 2021-08-22 DIAGNOSIS — Z0000 Encounter for general adult medical examination without abnormal findings: Secondary | ICD-10-CM | POA: Diagnosis not present

## 2021-08-23 DIAGNOSIS — Z1231 Encounter for screening mammogram for malignant neoplasm of breast: Secondary | ICD-10-CM | POA: Diagnosis not present

## 2021-09-02 DIAGNOSIS — E0590 Thyrotoxicosis, unspecified without thyrotoxic crisis or storm: Secondary | ICD-10-CM | POA: Diagnosis not present

## 2021-09-16 ENCOUNTER — Other Ambulatory Visit: Payer: Medicare Other

## 2021-09-19 ENCOUNTER — Encounter: Payer: Self-pay | Admitting: Primary Care

## 2021-09-19 ENCOUNTER — Ambulatory Visit: Payer: Medicare Other | Admitting: Primary Care

## 2021-09-19 ENCOUNTER — Other Ambulatory Visit: Payer: Self-pay

## 2021-09-19 ENCOUNTER — Telehealth: Payer: Self-pay | Admitting: Primary Care

## 2021-09-19 VITALS — BP 120/64 | HR 89 | Temp 97.6°F | Ht 65.5 in | Wt 229.4 lb

## 2021-09-19 DIAGNOSIS — J449 Chronic obstructive pulmonary disease, unspecified: Secondary | ICD-10-CM

## 2021-09-19 DIAGNOSIS — D869 Sarcoidosis, unspecified: Secondary | ICD-10-CM | POA: Diagnosis not present

## 2021-09-19 MED ORDER — FLUTICASONE FUROATE-VILANTEROL 200-25 MCG/ACT IN AEPB: 1.0000 | INHALATION_SPRAY | Freq: Every day | RESPIRATORY_TRACT | 5 refills | Status: DC

## 2021-09-19 MED ORDER — PREDNISONE 10 MG PO TABS: ORAL_TABLET | ORAL | 0 refills | Status: DC

## 2021-09-19 MED ORDER — ALBUTEROL SULFATE HFA 108 (90 BASE) MCG/ACT IN AERS
1.0000 | INHALATION_SPRAY | Freq: Four times a day (QID) | RESPIRATORY_TRACT | 1 refills | Status: DC | PRN
Start: 2021-09-19 — End: 2023-03-03

## 2021-09-19 MED ORDER — HYDROCODONE BIT-HOMATROP MBR 5-1.5 MG/5ML PO SOLN: 5.0000 mL | Freq: Four times a day (QID) | ORAL | 0 refills | Status: AC | PRN

## 2021-09-19 MED ORDER — MONTELUKAST SODIUM 10 MG PO TABS: 10.0000 mg | ORAL_TABLET | Freq: Every evening | ORAL | 3 refills | Status: DC | PRN

## 2021-09-19 MED ORDER — METHYLPREDNISOLONE ACETATE 80 MG/ML IJ SUSP
80.0000 mg | Freq: Once | INTRAMUSCULAR | Status: AC
  Administered 2021-09-19: 80 mg via INTRAMUSCULAR

## 2021-09-19 NOTE — Progress Notes (Signed)
@Patient  ID: , female    DOB: 1956-02-19, 65 y.o.   MRN: [id number]  Chief Complaint  Patient presents with   Follow-up    Pt has had complaints of wheezing, coughing, and fatigue which first began about 1.5 weeks ago. Cough is worse at night. States had a temp 2 days ago and took a covid test which was negative.    Referring provider: [id number], MD  HPI: 65 year old female, never smoked.  Past medical history significant for asthma with COPD, sarcoidosis, GERD, mitral regurgitation.  Patient of Dr. 79, last seen on 12/17/20.  09/19/2021- Interim hx  Patient presents today for acute visit. She complains of cough with associated wheezing for the last week and half. Covid test was negative x 2. She is compliant with BREO 13/01/2021 but has been overusing the last sever days d/t acute symptoms. Cough is dry, worse at night. She started her self back on prednisone, she took 30mg  x 3 days, 25mg  x 3 days and is currently on 20mg .    Allergies  Allergen Reactions   Shellfish Allergy Itching   Sulfa Antibiotics Other (See Comments)    Unknown    Immunization History  Administered Date(s) Administered   PFIZER(Purple Top)SARS-COV-2 Vaccination 01/24/2020, 02/13/2020, 03/05/2020    Past Medical History:  Diagnosis Date   Body mass index (BMI) of 30.0-30.9 in adult    Familial hypercholesterolemia    GERD (gastroesophageal reflux disease)    Hyperthyroidism    Mitral valve regurgitation    MVP (mitral valve prolapse)    Pulmonary sarcoidosis (HCC)    Tricuspid regurgitation     Tobacco History: Social History   Tobacco Use  Smoking Status Never  Smokeless Tobacco Never   Counseling given: Not Answered   Outpatient Medications Prior to Visit  Medication Sig Dispense Refill   acetaminophen (TYLENOL) 500 MG tablet Take 500 mg by mouth every 6 (six) hours as needed.     fluticasone furoate-vilanterol (BREO ELLIPTA) 200-25 MCG/INH AEPB Inhale 1 puff into the  lungs daily. 60 each 5   acetaminophen (TYLENOL) 650 MG CR tablet Take 650 mg by mouth every 8 (eight) hours as needed for pain (Hip pain).     Colchicine (MITIGARE) 0.6 MG CAPS Take 0.6 mg by mouth in the morning and at bedtime. 90 capsule 0   furosemide (LASIX) 40 MG tablet Take 40 mg by mouth once a week.     HYDROcodone-homatropine (HYCODAN) 5-1.5 MG/5ML syrup Take 5 mLs by mouth every 6 (six) hours as needed for cough. 120 mL 0   methimazole (TAPAZOLE) 10 MG tablet Take 40 mg by mouth daily.     montelukast (SINGULAIR) 10 MG tablet Take 10 mg by mouth at bedtime as needed (allergies).      pantoprazole (PROTONIX) 40 MG tablet Take 1 tablet (40 mg total) by mouth daily. 14 tablet 0   No facility-administered medications prior to visit.    Review of Systems  Review of Systems  Constitutional: Negative.   HENT: Negative.    Respiratory:  Positive for cough and wheezing.   Cardiovascular: Negative.     Physical Exam  BP 120/64 (BP Location: Right Arm, Patient Position: Sitting, Cuff Size: Large)   Pulse 89   Temp 97.6 F (36.4 C) (Oral)   Ht 5' 5.5" (1.664 m)   Wt 229 lb 6.4 oz (104.1 kg)   SpO2 97% Comment: RA  BMI 37.59 kg/m  Physical Exam Constitutional:      Appearance:  Normal appearance.  HENT:     Head: Normocephalic and atraumatic.  Cardiovascular:     Rate and Rhythm: Normal rate and regular rhythm.  Pulmonary:     Effort: Pulmonary effort is normal.     Breath sounds: Wheezing present.     Comments: Reactive cough Musculoskeletal:        General: Normal range of motion.  Skin:    General: Skin is warm and dry.  Neurological:     General: No focal deficit present.     Mental Status: She is alert and oriented to person, place, and time. Mental status is at baseline.  Psychiatric:        Mood and Affect: Mood normal.        Behavior: Behavior normal.        Thought Content: Thought content normal.        Judgment: Judgment normal.     Lab  Results:  CBC    Component Value Date/Time   WBC 5.0 10/19/2020 2201   RBC 4.21 10/19/2020 2201   HGB 11.4 (L) 10/19/2020 2201   HGB 11.7 05/31/2020 0818   HCT 36.5 10/19/2020 2201   HCT 34.7 05/31/2020 0818   PLT 168 10/19/2020 2201   PLT 157 05/31/2020 0818   MCV 86.7 10/19/2020 2201   MCV 87 05/31/2020 0818   MCH 27.1 10/19/2020 2201   MCHC 31.2 10/19/2020 2201   RDW 12.3 10/19/2020 2201   RDW 12.0 05/31/2020 0818    BMET    Component Value Date/Time   NA 143 11/21/2020 0000   K 4.3 11/21/2020 0000   CL 106 11/21/2020 0000   CO2 23 11/21/2020 0000   GLUCOSE 109 (H) 11/21/2020 0000   GLUCOSE 114 (H) 10/19/2020 2201   BUN 18 11/21/2020 0000   CREATININE 1.00 11/21/2020 0000   CALCIUM 9.6 11/21/2020 0000   GFRNONAA 59 (L) 11/21/2020 0000   GFRNONAA >60 10/19/2020 2201   GFRAA 68 11/21/2020 0000    BNP    Component Value Date/Time   BNP 26.0 10/20/2020 1223    ProBNP    Component Value Date/Time   PROBNP 1,486 (H) 09/10/2020 1018    Imaging: No results found.   Assessment & Plan:   Asthma with COPD (HCC) - Acute exacerbation. Cough with associated wheezing x 1.5 weeks. Covid negative x 2. Some improvement with oral prednisone. She has been overusing her maintenance inhaler BREO . Lungs were mostly clear on exam, she had some mild wheezing with reactive cough. She received depo-medrol 80mg  IM x1 in office today. RX for Prednisone taper 40mg  x 3 days; 30mg  x 3 days; 20mg  x 3 days, 10mg  x 3 day; along with Albuterol hfa 1-2 puffs q 6 hours prn and Hycodan cough syrup 63ml q 6 hour prn. Advised patient against using BREO more than directed. Patient to notify office if no improvement.   Sarcoidosis - Due for repeat CT chest in October 2022. We will follow-up on this.    , NP 09/19/2021

## 2021-09-19 NOTE — Patient Instructions (Signed)
Recommendations: Only use BREO once daily as directed We will send in Albuterol rescue inhaler that you can use 1-2 puffs every 6 hours for sob/wheezing  Take prescription cough medication as needed (do not drive or use with alcohol)  Take prednisone taper as directed starting tomorrow  Office treatment: Depo-medrol 80mg  IM x 1  Follow-up: Call if not better

## 2021-09-19 NOTE — Assessment & Plan Note (Signed)
-   Due for repeat CT chest in October 2022. We will follow-up on this.

## 2021-09-19 NOTE — Telephone Encounter (Signed)
Patient was scheduled for 10/31 but cancelled and stated they did not want the scan

## 2021-09-19 NOTE — Telephone Encounter (Signed)
Patient is due for CT chest in OCT 2022 for sarcoid, this has been ordered already by Dr. Delton Coombes. Can we follow-up on scheduling. Thanks

## 2021-09-19 NOTE — Assessment & Plan Note (Addendum)
-   Acute exacerbation. Cough with associated wheezing x 1.5 weeks. Covid negative x 2. Some improvement with oral prednisone. She has been overusing her maintenance inhaler BREO . Lungs were mostly clear on exam, she had some mild wheezing with reactive cough. She received depo-medrol 80mg  IM x1 in office today. RX for Prednisone taper 40mg  x 3 days; 30mg  x 3 days; 20mg  x 3 days, 10mg  x 3 day; along with Albuterol hfa 1-2 puffs q 6 hours prn and Hycodan cough syrup 50ml q 6 hour prn. Advised patient against using BREO more than directed. Patient to notify office if no improvement.

## 2021-09-20 ENCOUNTER — Telehealth: Payer: Self-pay | Admitting: Pharmacy Technician

## 2021-09-20 ENCOUNTER — Other Ambulatory Visit (HOSPITAL_COMMUNITY): Payer: Self-pay

## 2021-09-20 NOTE — Telephone Encounter (Signed)
Patient Advocate Encounter  Received notification from [employer] that prior authorization for BREO ELLIPTA 200 is required.   PA NOT REQUIRED. PAID CLAIM RECEIVED IN PHARMACY  Darnisha Vernet Roberto Scales, CPhT Patient Advocate [facility] Phone: [phone] Fax:  [phone]

## 2021-09-23 ENCOUNTER — Telehealth: Payer: Self-pay | Admitting: Primary Care

## 2021-09-23 NOTE — Telephone Encounter (Signed)
Will forward to EW to make her aware that this form will need to be completed.  thanks

## 2021-09-24 DIAGNOSIS — R202 Paresthesia of skin: Secondary | ICD-10-CM | POA: Diagnosis not present

## 2021-09-24 DIAGNOSIS — R7309 Other abnormal glucose: Secondary | ICD-10-CM | POA: Diagnosis not present

## 2021-09-24 NOTE — Telephone Encounter (Signed)
Yes, completed. Will put up front

## 2021-09-24 NOTE — Telephone Encounter (Signed)
I called and left a message to let patient know that the form was being filled out and we would place up front. Patient was asked to call back with any questions.

## 2021-10-01 DIAGNOSIS — E0590 Thyrotoxicosis, unspecified without thyrotoxic crisis or storm: Secondary | ICD-10-CM | POA: Diagnosis not present

## 2021-10-01 DIAGNOSIS — E0500 Thyrotoxicosis with diffuse goiter without thyrotoxic crisis or storm: Secondary | ICD-10-CM | POA: Diagnosis not present

## 2021-11-12 DIAGNOSIS — E0590 Thyrotoxicosis, unspecified without thyrotoxic crisis or storm: Secondary | ICD-10-CM | POA: Diagnosis not present

## 2021-11-15 NOTE — Progress Notes (Deleted)
Cardiology Office Note:    Date:  11/15/2021   ID:  Diana Calhoun, DOB 08/12/1956, MRN [id number]  PCP:  Renford Dills, MD   [facility] Providers Cardiologist:  Tobias Alexander, MD (Inactive) {   Referring MD: Renford Dills, MD    History of Present Illness:    Diana Calhoun is a 65 y.o. 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 Delton See who now returns to clinic for follow-up.  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 She also underwent calcium scoring which showed calcium score of 0. She had a prior echo  in 2019 that showed LVEF 55%, mild-moderate MR, mild TR, mildly elevated RVSP. 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 TEE was pursued with results below showing no prolapse but restricted posterior leaflet motion with moderate-severe MR and moderate TR. Repeat echo 10/17/20 was felt to be stable with EF 60-65%, grade 1 DD, mild-moderate LAE, posterior MV leaflet motion restricted with moderate MR without significant change from prior except no longer with elevated L or R atrial pressures.  Last saw Dr. Delton See in 01/2021 where she was having pleuritic chest pain with concern for pericarditis. She could not take NSAIDs due to GERD. She was started on colchicine.   Today, ***  Past Medical History:  Diagnosis Date   Body mass index (BMI) of 30.0-30.9 in adult    Familial hypercholesterolemia    GERD (gastroesophageal reflux disease)    Hyperthyroidism    Mitral valve regurgitation    MVP (mitral valve prolapse)    Pulmonary sarcoidosis (HCC)    Tricuspid regurgitation     Past Surgical History:  Procedure Laterality Date   CESAREAN SECTION     PERCARDIUM INFUSION     TEE WITHOUT CARDIOVERSION N/A 06/11/2020   Procedure: TRANSESOPHAGEAL  ECHOCARDIOGRAM (TEE);  Surgeon: Wendall Stade, MD;  Location: [facility];  Service: Cardiovascular;  Laterality: N/A;    Current Medications: No outpatient medications have been marked as taking for the 11/19/21 encounter (Appointment) with Meriam Sprague, MD.     Allergies:   Shellfish allergy and Sulfa antibiotics   Social History   Socioeconomic History   Marital status: Single    Spouse name: Not on file   Number of children: Not on file   Years of education: Not on file   Highest education level: Not on file  Occupational History   Not on file  Tobacco Use   Smoking status: Never   Smokeless tobacco: Never  Substance and Sexual Activity   Alcohol use: Not on file   Drug use: Not on file   Sexual activity: Not on file  Other Topics Concern   Not on file  Social History Narrative   Not on file   Social Determinants of Health   Financial Resource Strain: Not on file  Food Insecurity: Not on file  Transportation Needs: Not on file  Physical Activity: Not on file  Stress: Not on file  Social Connections: Not on file     Family History: The patient's ***family history includes Diabetes in her sister and sister; Hypertension in her mother.  ROS:   Please see the history of present illness.    *** All other systems reviewed and are negative.  EKGs/Labs/Other Studies Reviewed:    The following studies  were reviewed today: 10/17/20 1. Left ventricular ejection fraction, by estimation, is 60 to 65%. The  left ventricle has normal function. The left ventricle has no regional  wall motion abnormalities. Left ventricular diastolic parameters are  consistent with Grade I diastolic  dysfunction (impaired relaxation).   2. Right ventricular systolic function is normal. The right ventricular  size is normal. Tricuspid regurgitation signal is inadequate for assessing  PA pressure.   3. Left atrial size was mild to moderately dilated.   4. Mitral valve changes  suggest rheumatic or other postinflammatory  change. The posterior leaflet motion is restricted. The mitral  regurgitation jet is very eccentric and its severity may be  underestimated. Moderate mitral valve regurgitation. No  evidence of mitral stenosis.   5. The aortic valve is normal in structure. Aortic valve regurgitation is  not visualized. No aortic stenosis is present.   6. The inferior vena cava is normal in size with greater than 50%  respiratory variability, suggesting right atrial pressure of 3 mmHg.   Comparison(s): Prior images reviewed side by side. There is no significant  change in left ventricular systolic function or mitral valve findings, but  there is no longer evidence of elevated left or right atrial pressures.    06/11/20 TEE IMPRESSIONS     1. Left ventricular ejection fraction, by estimation, is 60 to 65%. The  left ventricle has normal function. The left ventricle has no regional  wall motion abnormalities.   2. Right ventricular systolic function is normal. The right ventricular  size is normal. There is normal pulmonary artery systolic pressure.   3. Left atrial size was moderately dilated. No left atrial/left atrial  appendage thrombus was detected.   4. No prolapse. Carpentier type 3A restricted posterior leaflet motion  Pisa Radius 0.8 , ERO .39 and RV 55 suggest moderate to severe disease. 3D  VC 0.87 suggests worse but there appears to be two MR jets one central and  one lateral which makes  assessing 3D VC difficult There was blunted systolic forward flow in the  PV;s but clearly no reversal . The mitral valve is abnormal. Moderate to  severe mitral valve regurgitation. No evidence of mitral stenosis.   5. Tricuspid valve regurgitation is moderate.   6. The aortic valve is tricuspid. Aortic valve regurgitation is not  visualized. No aortic stenosis is present.   7. The inferior vena cava is normal in size with greater than 50%  respiratory  variability, suggesting right atrial pressure of 3 mmHg.   Conclusion(s)/Recommendation(s): Normal biventricular function without  evidence of hemodynamically significant valvular heart disease.   EKG:  EKG is *** ordered today.  The ekg ordered today demonstrates ***  Recent Labs: 11/21/2020: BUN 18; Creatinine, Ser 1.00; Potassium 4.3; Sodium 143  Recent Lipid Panel    Component Value Date/Time   CHOL 181 05/31/2020 0818   TRIG 63 05/31/2020 0818   HDL 55 05/31/2020 0818   CHOLHDL 3.3 05/31/2020 0818   LDLCALC 114 (H) 05/31/2020 0818     Risk Assessment/Calculations:   {Does this patient have ATRIAL FIBRILLATION?:[phone]}       Physical Exam:    VS:  There were no vitals taken for this visit.    Wt Readings from Last 3 Encounters:  09/19/21 229 lb 6.4 oz (104.1 kg)  01/23/21 221 lb 3.2 oz (100.3 kg)  12/17/20 222 lb 3.2 oz (100.8 kg)     GEN: *** Well nourished, well developed in no acute distress  HEENT: Normal NECK: No JVD; No carotid bruits LYMPHATICS: No lymphadenopathy CARDIAC: ***RRR, no murmurs, rubs, gallops RESPIRATORY:  Clear to auscultation without rales, wheezing or rhonchi  ABDOMEN: Soft, non-tender, non-distended MUSCULOSKELETAL:  No edema; No deformity  SKIN: Warm and dry NEUROLOGIC:  Alert and oriented x 3 PSYCHIATRIC:  Normal affect   ASSESSMENT:    No diagnosis found. PLAN:    In order of problems listed above:  #Recurrent Pericarditis: Last episode 01/2021. Resolved with colchicine.  #Mitral Valve Disease with Restricted Leaflet Motion: #Moderate MR: Late systolic prolapse of the anterior MV leaflet with posteriorly directed MR and restricted posterior leaflet. There is moderate MR which was stable in 10/2020. -Repeat TTE   #Hyperthyroidism:  #HLD: Was supposed to start red yeast rice.      {Are you ordering a CV Procedure (e.g. stress test, cath, DCCV, TEE, etc)?   Press F2        :[id number]}    Medication Adjustments/Labs  and Tests Ordered: Current medicines are reviewed at length with the patient today.  Concerns regarding medicines are outlined above.  No orders of the defined types were placed in this encounter.  No orders of the defined types were placed in this encounter.   There are no Patient Instructions on file for this visit.   Signed, Meriam Sprague, MD  11/15/2021 7:43 PM    [facility]

## 2021-11-19 ENCOUNTER — Ambulatory Visit (INDEPENDENT_AMBULATORY_CARE_PROVIDER_SITE_OTHER): Payer: Self-pay | Admitting: Cardiology

## 2021-11-19 ENCOUNTER — Other Ambulatory Visit: Payer: Self-pay

## 2021-11-19 ENCOUNTER — Encounter: Payer: Self-pay | Admitting: Cardiology

## 2021-11-19 VITALS — BP 128/68 | HR 93 | Ht 65.5 in | Wt 232.6 lb

## 2021-11-19 DIAGNOSIS — Z8679 Personal history of other diseases of the circulatory system: Secondary | ICD-10-CM

## 2021-11-19 DIAGNOSIS — I10 Essential (primary) hypertension: Secondary | ICD-10-CM

## 2021-11-19 DIAGNOSIS — M1990 Unspecified osteoarthritis, unspecified site: Secondary | ICD-10-CM

## 2021-11-19 DIAGNOSIS — I340 Nonrheumatic mitral (valve) insufficiency: Secondary | ICD-10-CM

## 2021-11-19 DIAGNOSIS — I059 Rheumatic mitral valve disease, unspecified: Secondary | ICD-10-CM

## 2021-11-19 DIAGNOSIS — E669 Obesity, unspecified: Secondary | ICD-10-CM

## 2021-11-19 DIAGNOSIS — E785 Hyperlipidemia, unspecified: Secondary | ICD-10-CM

## 2021-11-19 NOTE — Patient Instructions (Addendum)
Medication Instructions:   Your physician recommends that you continue on your current medications as directed. Please refer to the Current Medication list given to you today.  *If you need a refill on your cardiac medications before your next appointment, please call your pharmacy*  You have been referred to [facility] YOUR ARTHRITIS   Testing/Procedures:  Your physician has requested that you have an echocardiogram. Echocardiography is a painless test that uses sound waves to create images of your heart. It provides your doctor with information about the size and shape of your heart and how well your hearts chambers and valves are working. This procedure takes approximately one hour. There are no restrictions for this procedure.   Follow-Up: At [facility], you and your health needs are our priority.  As part of our continuing mission to provide you with exceptional heart care, we have created designated Provider Care Teams.  These Care Teams include your primary Cardiologist (physician) and Advanced Practice Providers (APPs -  Physician Assistants and Nurse Practitioners) who all work together to provide you with the care you need, when you need it.  We recommend signing up for the patient portal called "MyChart".  Sign up information is provided on this After Visit Summary.  MyChart is used to connect with patients for Virtual Visits (Telemedicine).  Patients are able to view lab/test results, encounter notes, upcoming appointments, etc.  Non-urgent messages can be sent to your provider as well.   To learn more about what you can do with MyChart, go to [email or web address].    Your next appointment:   6 month(s)  The format for your next appointment:   In Person  Provider:   DR. Shari Prows

## 2021-11-19 NOTE — Progress Notes (Signed)
Cardiology Office Note:    Date:  11/19/2021   ID:  Diana Calhoun, DOB [birth year], MRN [id number]  PCP:  Renford DillsPolite, Ronald, MD   [facility] Providers Cardiologist:  Tobias AlexanderKatarina Nelson, MD {   Referring MD: Renford DillsPolite, Ronald, MD    History of Present Illness:    Diana Calhoun is a 66 y.o. 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 Delton SeeNelson who now returns to clinic for follow-up.  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 She also underwent calcium scoring which showed calcium score of 0. She had a prior echo  in 2019 that showed LVEF 55%, mild-moderate MR, mild TR, mildly elevated RVSP. 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 TEE was pursued with results below showing no prolapse but restricted posterior leaflet motion with moderate-severe MR and moderate TR. Repeat echo 10/17/20 was felt to be stable with EF 60-65%, grade 1 DD, mild-moderate LAE, posterior MV leaflet motion restricted with moderate MR without significant change from prior except no longer with elevated L or R atrial pressures.  Last saw Dr. Delton SeeNelson in 01/2021 where she was having pleuritic chest pain with concern for pericarditis. She could not take NSAIDs due to GERD. She was started on colchicine.   Today, she reports that her symptoms resolved after about a week, and she is no longer taking colchicine. She denies any recurrent flare-ups of her chest pain.  Since her previous chest pain, she has been experiencing intermittent right-sided weakness with activity. If she is active, such as going to the grocery store, she will notice feelings of right-sided weakness/tightness in the region of her waist to her right shoulder. She denies numbness, tingling, or known arthritis.   She endorses shortness of breath  with heavier exertion. After taking her Breo inhaler, her symptoms resolve in about 10-15 minutes. Otherwise, she is able to climb stairs without issues. Previously she was walking regularly for exercise with no symptoms, but she has not walked as much lately.  Additionally she complains of insomnia. For treatment she tries taking melatonin.  She denies any palpitations. No lightheadedness, headaches, syncope, orthopnea, PND, or lower extremity edema.  Past Medical History:  Diagnosis Date   Body mass index (BMI) of 30.0-30.9 in adult    Familial hypercholesterolemia    GERD (gastroesophageal reflux disease)    Hyperthyroidism    Mitral valve regurgitation    MVP (mitral valve prolapse)    Pulmonary sarcoidosis (HCC)    Tricuspid regurgitation     Past Surgical History:  Procedure Laterality Date   CESAREAN SECTION     PERCARDIUM INFUSION     TEE WITHOUT CARDIOVERSION N/A 06/11/2020   Procedure: TRANSESOPHAGEAL ECHOCARDIOGRAM (TEE);  Surgeon: Wendall StadeNishan, Peter C, MD;  [facility];  Service: Cardiovascular;  Laterality: N/A;    Current Medications: Current Meds  Medication Sig   acetaminophen (TYLENOL) 500 MG tablet Take 500 mg by mouth every 6 (six) hours as needed.   albuterol (VENTOLIN HFA) 108 (90 Base) MCG/ACT inhaler Inhale 1-2 puffs into the lungs every 6 (six) hours as needed for wheezing or shortness of breath.   fluticasone furoate-vilanterol (BREO ELLIPTA) 200-25 MCG/ACT AEPB Inhale 1 puff into the lungs daily.   HYDROcodone bit-homatropine (HYCODAN) 5-1.5 MG/5ML syrup Take 5 mLs by mouth every 6 (six) hours as needed  for cough.   montelukast (SINGULAIR) 10 MG tablet Take 1 tablet (10 mg total) by mouth at bedtime as needed (allergies).     Allergies:   Shellfish allergy and Sulfa antibiotics   Social History   Socioeconomic History   Marital status: Single    Spouse name: Not on file   Number of children: Not on file   Years of education: Not on file    Highest education level: Not on file  Occupational History   Not on file  Tobacco Use   Smoking status: Never   Smokeless tobacco: Never  Substance and Sexual Activity   Alcohol use: Not on file   Drug use: Not on file   Sexual activity: Not on file  Other Topics Concern   Not on file  Social History Narrative   Not on file   Social Determinants of Health   Financial Resource Strain: Not on file  Food Insecurity: Not on file  Transportation Needs: Not on file  Physical Activity: Not on file  Stress: Not on file  Social Connections: Not on file     Family History: The patient's family history includes Diabetes in her sister and sister; Hypertension in her mother.  ROS:   Review of Systems  Constitutional:  Negative for chills and diaphoresis.  HENT:  Negative for hearing loss and nosebleeds.   Eyes:  Negative for double vision and pain.  Respiratory:  Positive for shortness of breath. Negative for hemoptysis and sputum production.   Cardiovascular:  Negative for chest pain, palpitations, orthopnea, claudication, leg swelling and PND.  Gastrointestinal:  Negative for diarrhea, nausea and vomiting.  Genitourinary:  Negative for dysuria and frequency.  Musculoskeletal:  Negative for falls and neck pain.  Skin:  Negative for itching.  Neurological:  Positive for focal weakness. Negative for dizziness, seizures and headaches.  Endo/Heme/Allergies:  Negative for polydipsia.  Psychiatric/Behavioral:  Negative for suicidal ideas. The patient has insomnia.     EKGs/Labs/Other Studies Reviewed:    The following studies were reviewed today:  Echo 10/17/20: 1. Left ventricular ejection fraction, by estimation, is 60 to 65%. The  left ventricle has normal function. The left ventricle has no regional  wall motion abnormalities. Left ventricular diastolic parameters are  consistent with Grade I diastolic  dysfunction (impaired relaxation).   2. Right ventricular systolic function  is normal. The right ventricular  size is normal. Tricuspid regurgitation signal is inadequate for assessing  PA pressure.   3. Left atrial size was mild to moderately dilated.   4. Mitral valve changes suggest rheumatic or other postinflammatory  change. The posterior leaflet motion is restricted. The mitral  regurgitation jet is very eccentric and its severity may be  underestimated. Moderate mitral valve regurgitation. No  evidence of mitral stenosis.   5. The aortic valve is normal in structure. Aortic valve regurgitation is  not visualized. No aortic stenosis is present.   6. The inferior vena cava is normal in size with greater than 50%  respiratory variability, suggesting right atrial pressure of 3 mmHg.   Comparison(s): Prior images reviewed side by side. There is no significant  change in left ventricular systolic function or mitral valve findings, but  there is no longer evidence of elevated left or right atrial pressures.   CT Chest 09/12/2020:  FINDINGS: Cardiovascular: Heart size is normal. No pericardial effusion. Aortic caliber is normal. Central pulmonary vasculature on venous phase assessment is unremarkable.   Mediastinum/Nodes: Bulky adenopathy, bi hilar, subcarinal and  RIGHT paratracheal.   RIGHT paratracheal lymph node (image 45, series 2) 16 mm short axis.   (Image 61, series 2) 20 mm short axis subcarinal lymph node.   (Image 75, series 2) 14 mm subcarinal nodal enlargement.   (Image 59, series 2) 26 mm RIGHT hilar lymph node.   LEFT hilar lymph nodes also enlarged but less enlarged than RIGHT hilar lymph nodes.   Smaller pre-vascular lymph nodes less than a cm. No frank adenopathy in the anterior mediastinum. No thoracic inlet adenopathy. No axillary lymphadenopathy.   Lungs/Pleura: No consolidation.  No pleural effusion.   Ground-glass nodule in the LEFT lung base (image 111, series 3) 9 x 8 mm. Limited assessment of this area due to motion  artifact. Airways are patent.   Upper Abdomen: Incidental imaging of upper abdominal contents is unremarkable. Adrenal glands are normal. No upper abdominal lymphadenopathy.   Musculoskeletal: No acute musculoskeletal process. No destructive bone process finding.   IMPRESSION: 1. Bulky adenopathy, bihilar, subcarinal and RIGHT paratracheal lymph nodes. Pattern of disease is compatible with provided history of sarcoidosis. Given that this is a diagnosis of exclusion would ensure correlation with prior pathology results. No prior imaging is available for review. 2. No septal thickening, bronchiectasis or peribronchovascular nodularity. No acute cardiopulmonary findings. 3. Ground-glass nodule in the LEFT lung base measuring 9 mm mean diameter. Mildly limited assessment due to motion artifact. Initial follow-up with CT at 6-12 months is recommended to confirm persistence. If persistent, repeat CT is recommended every 2 years until 5 years of stability has been established. This recommendation follows the consensus statement: Guidelines for Management of Incidental Pulmonary Nodules Detected on CT Images: From the Fleischner Society 2017; Radiology 2017; 284:228-243. 4. Aortic atherosclerosis.   Aortic Atherosclerosis (ICD10-I70.0).  06/11/20 TEE: IMPRESSIONS    1. Left ventricular ejection fraction, by estimation, is 60 to 65%. The  left ventricle has normal function. The left ventricle has no regional  wall motion abnormalities.   2. Right ventricular systolic function is normal. The right ventricular  size is normal. There is normal pulmonary artery systolic pressure.   3. Left atrial size was moderately dilated. No left atrial/left atrial  appendage thrombus was detected.   4. No prolapse. Carpentier type 3A restricted posterior leaflet motion  Pisa Radius 0.8 , ERO .39 and RV 55 suggest moderate to severe disease. 3D  VC 0.87 suggests worse but there appears to be two MR  jets one central and  one lateral which makes  assessing 3D VC difficult There was blunted systolic forward flow in the  PV;s but clearly no reversal . The mitral valve is abnormal. Moderate to  severe mitral valve regurgitation. No evidence of mitral stenosis.   5. Tricuspid valve regurgitation is moderate.   6. The aortic valve is tricuspid. Aortic valve regurgitation is not  visualized. No aortic stenosis is present.   7. The inferior vena cava is normal in size with greater than 50%  respiratory variability, suggesting right atrial pressure of 3 mmHg.   Conclusion(s)/Recommendation(s): Normal biventricular function without  evidence of hemodynamically significant valvular heart disease.   EKG:   11/19/2021: Sinus rhythm. Rate 93 bpm.  Recent Labs: 11/21/2020: BUN 18; Creatinine, Ser 1.00; Potassium 4.3; Sodium 143   Recent Lipid Panel    Component Value Date/Time   CHOL 181 05/31/2020 0818   TRIG 63 05/31/2020 0818   HDL 55 05/31/2020 0818   CHOLHDL 3.3 05/31/2020 0818   LDLCALC 114 (H) 05/31/2020 0818  Risk Assessment/Calculations:           Physical Exam:    VS:  BP 128/68    Pulse 93    Ht 5' 5.5" (1.664 m)    Wt 232 lb 9.6 oz (105.5 kg)    SpO2 98%    BMI 38.12 kg/m     Wt Readings from Last 3 Encounters:  11/19/21 232 lb 9.6 oz (105.5 kg)  09/19/21 229 lb 6.4 oz (104.1 kg)  01/23/21 221 lb 3.2 oz (100.3 kg)     GEN: Well nourished, well developed in no acute distress HEENT: Normal NECK: No JVD; No carotid bruits CARDIAC: RRR, 2/6 blowing systolic murmur RESPIRATORY:  Clear to auscultation without rales, wheezing or rhonchi  ABDOMEN: Soft, non-tender, non-distended MUSCULOSKELETAL:  No edema; No deformity  SKIN: Warm and dry NEUROLOGIC:  Alert and oriented x 3 PSYCHIATRIC:  Normal affect   ASSESSMENT:    1. History of pericarditis   2. Mitral valve insufficiency, unspecified etiology   3. Arthritis   4. Hyperlipidemia, unspecified hyperlipidemia  type   5. Mitral valve disease   6. Obesity (BMI 30-39.9)   7. Essential hypertension    PLAN:    In order of problems listed above:  #Recurrent Pericarditis: Last episode 01/2021. Resolved with colchicine. No recurrence since that time. -Continue to monitor -Off colchicine  #Mitral Valve Disease with Restricted Leaflet Motion: #Moderate MR: Late systolic prolapse of the anterior MV leaflet with posteriorly directed MR and restricted posterior leaflet. There is moderate MR which was stable in 10/2020. Denies exertional or heart failure symptoms. Will repeat TTE for monitoring -Repeat TTE   #Hyperthyroidism s/p RAI: -Follow-up with endocrinology as scheduled  #HLD: Was supposed to start red yeast rice. Does not look like she has started. Will do trial of lifestyle as below and repeat lipids at next visit.  -Lifestyle modifications -Consider Ca score at next visit -Likely needs statin if patient amenable  #Obesity: BMI 38. Patient interested in losing weight but has been having a difficult time. Will check on coverage for GLP-1 in addition to lifestyle modifications. -Check into coverage for GLP-1 agonist -Lifestyle modifications as detailed below  Exercise recommendations: Goal of exercising for at least 30 minutes a day, at least 5 times per week.  Please exercise to a moderate exertion.  This means that while exercising it is difficult to speak in full sentences, however you are not so short of breath that you feel you must stop, and not so comfortable that you can carry on a full conversation.  Exertion level should be approximately a 5/10, if 10 is the most exertion you can perform.  Diet recommendations: Recommend a heart healthy diet such as the Mediterranean diet.  This diet consists of plant based foods, healthy fats, lean meats, olive oil.  It suggests limiting the intake of simple carbohydrates such as white breads, pastries, and pastas.  It also limits the amount of red  meat, wine, and dairy products such as cheese that one should consume on a daily basis.          Follow-up:  6 months  Medication Adjustments/Labs and Tests Ordered: Current medicines are reviewed at length with the patient today.  Concerns regarding medicines are outlined above.   Orders Placed This Encounter  Procedures   AMB referral to sports medicine   EKG 12-Lead   ECHOCARDIOGRAM COMPLETE   No orders of the defined types were placed in this encounter.  Patient Instructions  Medication  Instructions:   Your physician recommends that you continue on your current medications as directed. Please refer to the Current Medication list given to you today.  *If you need a refill on your cardiac medications before your next appointment, please call your pharmacy*  You have been referred to SPORTS MEDICINE TO SEE [facility] FOR YOUR ARTHRITIS   Testing/Procedures:  Your physician has requested that you have an echocardiogram. Echocardiography is a painless test that uses sound waves to create images of your heart. It provides your doctor with information about the size and shape of your heart and how well your hearts chambers and valves are working. This procedure takes approximately one hour. There are no restrictions for this procedure.   Follow-Up: At [facility], you and your health needs are our priority.  As part of our continuing mission to provide you with exceptional heart care, we have created designated Provider Care Teams.  These Care Teams include your primary Cardiologist (physician) and Advanced Practice Providers (APPs -  Physician Assistants and Nurse Practitioners) who all work together to provide you with the care you need, when you need it.  We recommend signing up for the patient portal called "MyChart".  Sign up information is provided on this After Visit Summary.  MyChart is used to connect with patients for Virtual Visits (Telemedicine).  Patients are able  to view lab/test results, encounter notes, upcoming appointments, etc.  Non-urgent messages can be sent to your provider as well.   To learn more about what you can do with MyChart, go to [email or web address].    Your next appointment:   6 month(s)  The format for your next appointment:   In Person  Provider:   DR. Chales Abrahams Stumpf,acting as a scribe for Meriam Sprague, MD.,have documented all relevant documentation on the behalf of Meriam Sprague, MD,as directed by  Meriam Sprague, MD while in the presence of Meriam Sprague, MD.  I, Meriam Sprague, MD, have reviewed all documentation for this visit. The documentation on 11/19/21 for the exam, diagnosis, procedures, and orders are all accurate and complete.   Signed, Meriam Sprague, MD  11/19/2021 12:38 PM    [facility]

## 2021-11-21 ENCOUNTER — Telehealth: Payer: Self-pay | Admitting: Pharmacist

## 2021-11-21 MED ORDER — OZEMPIC (0.25 OR 0.5 MG/DOSE) 2 MG/1.5ML ~~LOC~~ SOPN: PEN_INJECTOR | SUBCUTANEOUS | 1 refills | Status: DC

## 2021-11-21 NOTE — Telephone Encounter (Signed)
Pt referred to initiate [place] therapy for weight loss. She has Medicare Part D plan so Mancel Parsons is excluded from coverage. Ozempic is available on her formulary without a prior authorization though. Rx sent to pharmacy to determine copay, then will call pt to discuss.

## 2021-11-21 NOTE — Telephone Encounter (Signed)
Called pharmacy, Ozempic copay is $90 for a 1 month supply. Spoke with pt, this is currently not affordable for her. She was appreciative that I looked into the cost and will let us know if she changes insurance plans in the future and we can revisit the cost.

## 2021-11-28 ENCOUNTER — Encounter: Payer: Self-pay | Admitting: Family Medicine

## 2021-12-04 DIAGNOSIS — M170 Bilateral primary osteoarthritis of knee: Secondary | ICD-10-CM | POA: Diagnosis not present

## 2021-12-04 DIAGNOSIS — M545 Low back pain, unspecified: Secondary | ICD-10-CM | POA: Diagnosis not present

## 2021-12-04 DIAGNOSIS — K219 Gastro-esophageal reflux disease without esophagitis: Secondary | ICD-10-CM | POA: Diagnosis not present

## 2021-12-04 DIAGNOSIS — H9201 Otalgia, right ear: Secondary | ICD-10-CM | POA: Diagnosis not present

## 2021-12-04 DIAGNOSIS — G8929 Other chronic pain: Secondary | ICD-10-CM | POA: Diagnosis not present

## 2021-12-05 ENCOUNTER — Ambulatory Visit: Payer: No Typology Code available for payment source | Admitting: Neurology

## 2021-12-05 ENCOUNTER — Other Ambulatory Visit: Payer: Self-pay | Admitting: Emergency Medicine

## 2021-12-10 ENCOUNTER — Ambulatory Visit (HOSPITAL_COMMUNITY): Payer: No Typology Code available for payment source | Attending: Cardiology

## 2021-12-10 ENCOUNTER — Other Ambulatory Visit: Payer: Self-pay

## 2021-12-10 DIAGNOSIS — I340 Nonrheumatic mitral (valve) insufficiency: Secondary | ICD-10-CM | POA: Insufficient documentation

## 2021-12-10 LAB — ECHOCARDIOGRAM COMPLETE
Area-P 1/2: 2.94 cm2
MV M vel: 5.14 m/s
MV Peak grad: 105.7 mmHg
Radius: 0.55 cm
S' Lateral: 2.9 cm

## 2021-12-12 ENCOUNTER — Telehealth: Payer: Self-pay | Admitting: *Deleted

## 2021-12-12 DIAGNOSIS — I059 Rheumatic mitral valve disease, unspecified: Secondary | ICD-10-CM

## 2021-12-12 DIAGNOSIS — I340 Nonrheumatic mitral (valve) insufficiency: Secondary | ICD-10-CM

## 2021-12-12 NOTE — Telephone Encounter (Signed)
-----   Message from Meriam Sprague, MD sent at 12/11/2021  8:53 PM EST ----- Her echo shows that her pumping function is normal. There is stable, moderate leakiness of her mitral valve which is similar to her prior study. We will continue to monitor with yearly echoes.

## 2021-12-12 NOTE — Telephone Encounter (Signed)
The patient has been notified of the result and verbalized understanding.  All questions (if any) were answered.  Pt aware that I will go ahead and place the repeat echo in one year in the system and send a message to our Echo Scheduler to call her back and arrange this appt closer to that time.  Pt verbalized understanding and agrees with this plan.

## 2022-01-07 DIAGNOSIS — E0500 Thyrotoxicosis with diffuse goiter without thyrotoxic crisis or storm: Secondary | ICD-10-CM | POA: Diagnosis not present

## 2022-01-07 DIAGNOSIS — E0590 Thyrotoxicosis, unspecified without thyrotoxic crisis or storm: Secondary | ICD-10-CM | POA: Diagnosis not present

## 2022-01-27 ENCOUNTER — Ambulatory Visit: Payer: No Typology Code available for payment source | Admitting: Neurology

## 2022-01-27 ENCOUNTER — Encounter: Payer: Self-pay | Admitting: Neurology

## 2022-02-04 DIAGNOSIS — E0590 Thyrotoxicosis, unspecified without thyrotoxic crisis or storm: Secondary | ICD-10-CM | POA: Diagnosis not present

## 2022-02-17 ENCOUNTER — Telehealth: Payer: Self-pay | Admitting: Emergency Medicine

## 2022-02-17 MED ORDER — FLUTICASONE FUROATE-VILANTEROL 200-25 MCG/ACT IN AEPB: 1.0000 | INHALATION_SPRAY | Freq: Every day | RESPIRATORY_TRACT | 5 refills | Status: DC

## 2022-02-17 NOTE — Telephone Encounter (Signed)
Rx for breo sent into pharmacy. Nothing further needed ?

## 2022-03-05 DIAGNOSIS — M25511 Pain in right shoulder: Secondary | ICD-10-CM | POA: Diagnosis not present

## 2022-03-05 DIAGNOSIS — L299 Pruritus, unspecified: Secondary | ICD-10-CM | POA: Diagnosis not present

## 2022-03-05 DIAGNOSIS — R4589 Other symptoms and signs involving emotional state: Secondary | ICD-10-CM | POA: Diagnosis not present

## 2022-03-05 DIAGNOSIS — G8929 Other chronic pain: Secondary | ICD-10-CM | POA: Diagnosis not present

## 2022-03-05 DIAGNOSIS — M170 Bilateral primary osteoarthritis of knee: Secondary | ICD-10-CM | POA: Diagnosis not present

## 2022-03-05 DIAGNOSIS — J301 Allergic rhinitis due to pollen: Secondary | ICD-10-CM | POA: Diagnosis not present

## 2022-03-12 DIAGNOSIS — E0500 Thyrotoxicosis with diffuse goiter without thyrotoxic crisis or storm: Secondary | ICD-10-CM | POA: Diagnosis not present

## 2022-03-25 ENCOUNTER — Ambulatory Visit: Payer: No Typology Code available for payment source | Admitting: Emergency Medicine

## 2022-03-25 DIAGNOSIS — M1612 Unilateral primary osteoarthritis, left hip: Secondary | ICD-10-CM | POA: Diagnosis not present

## 2022-04-10 DIAGNOSIS — R262 Difficulty in walking, not elsewhere classified: Secondary | ICD-10-CM | POA: Diagnosis not present

## 2022-04-10 DIAGNOSIS — M1612 Unilateral primary osteoarthritis, left hip: Secondary | ICD-10-CM | POA: Diagnosis not present

## 2022-04-10 DIAGNOSIS — M6281 Muscle weakness (generalized): Secondary | ICD-10-CM | POA: Diagnosis not present

## 2022-04-16 DIAGNOSIS — E0500 Thyrotoxicosis with diffuse goiter without thyrotoxic crisis or storm: Secondary | ICD-10-CM | POA: Diagnosis not present

## 2022-04-16 DIAGNOSIS — R300 Dysuria: Secondary | ICD-10-CM | POA: Diagnosis not present

## 2022-04-21 ENCOUNTER — Ambulatory Visit: Payer: No Typology Code available for payment source | Admitting: Primary Care

## 2022-04-21 NOTE — Progress Notes (Deleted)
Cardiology Office Note:    Date:  04/21/2022   ID:  Diana Calhoun, DOB 03/07/1956, MRN [id number]  PCP:  Renford Dills, MD   [facility] Providers Cardiologist:  Tobias Alexander, MD {   Referring MD: Renford Dills, MD    History of Present Illness:    Diana Calhoun is a 66 y.o. 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 Delton See who now returns to clinic for follow-up.  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 She also underwent calcium scoring which showed calcium score of 0. She had a prior echo  in 2019 that showed LVEF 55%, mild-moderate MR, mild TR, mildly elevated RVSP. 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 TEE was pursued with results below showing no prolapse but restricted posterior leaflet motion with moderate-severe MR and moderate TR. Repeat echo 10/17/20 was felt to be stable with EF 60-65%, grade 1 DD, mild-moderate LAE, posterior MV leaflet motion restricted with moderate MR without significant change from prior except no longer with elevated L or R atrial pressures.  Saw Dr. Delton See in 01/2021 where she was having pleuritic chest pain with concern for pericarditis. She could not take NSAIDs due to GERD. She was started on colchicine.   Was last seen in clinic on 11/2021 where she was doing well. Chest pain had resolved and she was off colchicine. Was having some mild DOE. TTE 11/2021 showed LVEF 60-65%, normal RV, normal PASP, moderate MR, RAP 3.  Today, ***  Past Medical History:  Diagnosis Date   Body mass index (BMI) of 30.0-30.9 in adult    Familial hypercholesterolemia    GERD (gastroesophageal reflux disease)    Hyperthyroidism    Mitral valve regurgitation    MVP (mitral valve prolapse)    Pulmonary sarcoidosis (HCC)    Tricuspid  regurgitation     Past Surgical History:  Procedure Laterality Date   CESAREAN SECTION     PERCARDIUM INFUSION     TEE WITHOUT CARDIOVERSION N/A 06/11/2020   Procedure: TRANSESOPHAGEAL ECHOCARDIOGRAM (TEE);  Surgeon: Wendall Stade, MD;  Location: [facility] ENDOSCOPY;  Service: Cardiovascular;  Laterality: N/A;    Current Medications: No outpatient medications have been marked as taking for the 04/25/22 encounter (Appointment) with Meriam Sprague, MD.     Allergies:   Shellfish allergy and Sulfa antibiotics   Social History   Socioeconomic History   Marital status: Single    Spouse name: Not on file   Number of children: Not on file   Years of education: Not on file   Highest education level: Not on file  Occupational History   Not on file  Tobacco Use   Smoking status: Never   Smokeless tobacco: Never  Substance and Sexual Activity   Alcohol use: Not on file   Drug use: Not on file   Sexual activity: Not on file  Other Topics Concern   Not on file  Social History Narrative   Not on file   Social Determinants of Health   Financial Resource Strain: Not on file  Food Insecurity: Not on file  Transportation Needs: Not on file  Physical Activity: Not on file  Stress: Not on file  Social Connections: Not on file     Family History: The patient's family history includes Diabetes in her sister  and sister; Hypertension in her mother.  ROS:   Review of Systems  Constitutional:  Negative for chills and diaphoresis.  HENT:  Negative for hearing loss and nosebleeds.   Eyes:  Negative for double vision and pain.  Respiratory:  Positive for shortness of breath. Negative for hemoptysis and sputum production.   Cardiovascular:  Negative for chest pain, palpitations, orthopnea, claudication, leg swelling and PND.  Gastrointestinal:  Negative for diarrhea, nausea and vomiting.  Genitourinary:  Negative for dysuria and frequency.  Musculoskeletal:  Negative for falls and neck  pain.  Skin:  Negative for itching.  Neurological:  Positive for focal weakness. Negative for dizziness, seizures and headaches.  Endo/Heme/Allergies:  Negative for polydipsia.  Psychiatric/Behavioral:  Negative for suicidal ideas. The patient has insomnia.     EKGs/Labs/Other Studies Reviewed:    The following studies were reviewed today: TTE 01-11-2022: IMPRESSIONS     1. Left ventricular ejection fraction, by estimation, is 60 to 65%. The  left ventricle has normal function. The left ventricle has no regional  wall motion abnormalities. Left ventricular diastolic parameters were  normal.   2. Right ventricular systolic function is normal. The right ventricular  size is normal. There is normal pulmonary artery systolic pressure.   3. Left atrial size was moderately dilated.   4. PISA 0.5 cm      ERO 0.14 cm2      MR Vol 24 ml. The mitral valve is rheumatic. Moderate mitral valve  regurgitation. No evidence of mitral stenosis. There is mild holosystolic  prolapse of the middle segment of the anterior leaflet of the mitral  valve.   5. The aortic valve is normal in structure. Aortic valve regurgitation is  not visualized. No aortic stenosis is present.   6. The inferior vena cava is normal in size with greater than 50%  respiratory variability, suggesting right atrial pressure of 3 mmHg.   Echo 10/17/20: 1. Left ventricular ejection fraction, by estimation, is 60 to 65%. The  left ventricle has normal function. The left ventricle has no regional  wall motion abnormalities. Left ventricular diastolic parameters are  consistent with Grade I diastolic  dysfunction (impaired relaxation).   2. Right ventricular systolic function is normal. The right ventricular  size is normal. Tricuspid regurgitation signal is inadequate for assessing  PA pressure.   3. Left atrial size was mild to moderately dilated.   4. Mitral valve changes suggest rheumatic or other postinflammatory  change.  The posterior leaflet motion is restricted. The mitral  regurgitation jet is very eccentric and its severity may be  underestimated. Moderate mitral valve regurgitation. No  evidence of mitral stenosis.   5. The aortic valve is normal in structure. Aortic valve regurgitation is  not visualized. No aortic stenosis is present.   6. The inferior vena cava is normal in size with greater than 50%  respiratory variability, suggesting right atrial pressure of 3 mmHg.   Comparison(s): Prior images reviewed side by side. There is no significant  change in left ventricular systolic function or mitral valve findings, but  there is no longer evidence of elevated left or right atrial pressures.   CT Chest 09/12/2020:  FINDINGS: Cardiovascular: Heart size is normal. No pericardial effusion. Aortic caliber is normal. Central pulmonary vasculature on venous phase assessment is unremarkable.   Mediastinum/Nodes: Bulky adenopathy, bi hilar, subcarinal and RIGHT paratracheal.   RIGHT paratracheal lymph node (image 45, series 2) 16 mm short axis.   (Image 61, series 2) 20  mm short axis subcarinal lymph node.   (Image 75, series 2) 14 mm subcarinal nodal enlargement.   (Image 59, series 2) 26 mm RIGHT hilar lymph node.   LEFT hilar lymph nodes also enlarged but less enlarged than RIGHT hilar lymph nodes.   Smaller pre-vascular lymph nodes less than a cm. No frank adenopathy in the anterior mediastinum. No thoracic inlet adenopathy. No axillary lymphadenopathy.   Lungs/Pleura: No consolidation.  No pleural effusion.   Ground-glass nodule in the LEFT lung base (image 111, series 3) 9 x 8 mm. Limited assessment of this area due to motion artifact. Airways are patent.   Upper Abdomen: Incidental imaging of upper abdominal contents is unremarkable. Adrenal glands are normal. No upper abdominal lymphadenopathy.   Musculoskeletal: No acute musculoskeletal process. No destructive bone process  finding.   IMPRESSION: 1. Bulky adenopathy, bihilar, subcarinal and RIGHT paratracheal lymph nodes. Pattern of disease is compatible with provided history of sarcoidosis. Given that this is a diagnosis of exclusion would ensure correlation with prior pathology results. No prior imaging is available for review. 2. No septal thickening, bronchiectasis or peribronchovascular nodularity. No acute cardiopulmonary findings. 3. Ground-glass nodule in the LEFT lung base measuring 9 mm mean diameter. Mildly limited assessment due to motion artifact. Initial follow-up with CT at 6-12 months is recommended to confirm persistence. If persistent, repeat CT is recommended every 2 years until 5 years of stability has been established. This recommendation follows the consensus statement: Guidelines for Management of Incidental Pulmonary Nodules Detected on CT Images: From the Fleischner Society 2017; Radiology 2017; 284:228-243. 4. Aortic atherosclerosis.   Aortic Atherosclerosis (ICD10-I70.0).  06/11/20 TEE: IMPRESSIONS    1. Left ventricular ejection fraction, by estimation, is 60 to 65%. The  left ventricle has normal function. The left ventricle has no regional  wall motion abnormalities.   2. Right ventricular systolic function is normal. The right ventricular  size is normal. There is normal pulmonary artery systolic pressure.   3. Left atrial size was moderately dilated. No left atrial/left atrial  appendage thrombus was detected.   4. No prolapse. Carpentier type 3A restricted posterior leaflet motion  Pisa Radius 0.8 , ERO .39 and RV 55 suggest moderate to severe disease. 3D  VC 0.87 suggests worse but there appears to be two MR jets one central and  one lateral which makes  assessing 3D VC difficult There was blunted systolic forward flow in the  PV;s but clearly no reversal . The mitral valve is abnormal. Moderate to  severe mitral valve regurgitation. No evidence of mitral stenosis.    5. Tricuspid valve regurgitation is moderate.   6. The aortic valve is tricuspid. Aortic valve regurgitation is not  visualized. No aortic stenosis is present.   7. The inferior vena cava is normal in size with greater than 50%  respiratory variability, suggesting right atrial pressure of 3 mmHg.   Conclusion(s)/Recommendation(s): Normal biventricular function without  evidence of hemodynamically significant valvular heart disease.   EKG:   11/19/2021: Sinus rhythm. Rate 93 bpm.  Recent Labs: No results found for requested labs within last 8760 hours.   Recent Lipid Panel    Component Value Date/Time   CHOL 181 05/31/2020 0818   TRIG 63 05/31/2020 0818   HDL 55 05/31/2020 0818   CHOLHDL 3.3 05/31/2020 0818   LDLCALC 114 (H) 05/31/2020 0818     Risk Assessment/Calculations:           Physical Exam:    VS:  There  were no vitals taken for this visit.    Wt Readings from Last 3 Encounters:  11/19/21 232 lb 9.6 oz (105.5 kg)  09/19/21 229 lb 6.4 oz (104.1 kg)  01/23/21 221 lb 3.2 oz (100.3 kg)     GEN: Well nourished, well developed in no acute distress HEENT: Normal NECK: No JVD; No carotid bruits CARDIAC: RRR, 2/6 blowing systolic murmur RESPIRATORY:  Clear to auscultation without rales, wheezing or rhonchi  ABDOMEN: Soft, non-tender, non-distended MUSCULOSKELETAL:  No edema; No deformity  SKIN: Warm and dry NEUROLOGIC:  Alert and oriented x 3 PSYCHIATRIC:  Normal affect   ASSESSMENT:    No diagnosis found.  PLAN:    In order of problems listed above:  #Recurrent Pericarditis: Last episode 01/2021. Resolved with colchicine. No recurrence since that time. 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 Normal LVEF 60-65%. Normal RV. Normal PASP.  -Continue serial monitoring with repeat TTEs every year with next  11/2022  #Hyperthyroidism s/p RAI: -Follow-up with endocrinology as scheduled  #HLD: Ca score 0 in [place] about 3 years ago. Not on statins due to severe myalgias. -Lifestyle modifications -Ca score 0 in [place] -Declined cholesterol lowering therapy  #Obesity: BMI 38. Working on lifestyle modifications. -Lifestyle modifications as detailed below  Exercise recommendations: Goal of exercising for at least 30 minutes a day, at least 5 times per week.  Please exercise to a moderate exertion.  This means that while exercising it is difficult to speak in full sentences, however you are not so short of breath that you feel you must stop, and not so comfortable that you can carry on a full conversation.  Exertion level should be approximately a 5/10, if 10 is the most exertion you can perform.  Diet recommendations: Recommend a heart healthy diet such as the Mediterranean diet.  This diet consists of plant based foods, healthy fats, lean meats, olive oil.  It suggests limiting the intake of simple carbohydrates such as white breads, pastries, and pastas.  It also limits the amount of red meat, wine, and dairy products such as cheese that one should consume on a daily basis.          Follow-up:  6 months  Medication Adjustments/Labs and Tests Ordered: Current medicines are reviewed at length with the patient today.  Concerns regarding medicines are outlined above.   No orders of the defined types were placed in this encounter.  No orders of the defined types were placed in this encounter.  There are no Patient Instructions on file for this visit.    I,Mathew Stumpf,acting as a Neurosurgeonscribe for Meriam SpragueHeather E Accalia Rigdon, MD.,have documented all relevant documentation on the behalf of Meriam SpragueHeather E Avin Gibbons, MD,as directed by  Meriam SpragueHeather E Jilberto Vanderwall, MD while in the presence of Meriam SpragueHeather E Nahzir Pohle, MD.  I, Meriam SpragueHeather E Harpreet Pompey, MD, have reviewed all documentation for this visit. The documentation on  04/21/22 for the exam, diagnosis, procedures, and orders are all accurate and complete.   Signed, Meriam SpragueHeather E Halen Antenucci, MD  04/21/2022 2:08 PM    [facility]

## 2022-04-25 ENCOUNTER — Ambulatory Visit (INDEPENDENT_AMBULATORY_CARE_PROVIDER_SITE_OTHER): Payer: No Typology Code available for payment source | Admitting: Cardiology

## 2022-04-25 ENCOUNTER — Encounter: Payer: Self-pay | Admitting: Cardiology

## 2022-04-25 VITALS — BP 118/68 | HR 79 | Ht 65.5 in | Wt 243.0 lb

## 2022-04-25 DIAGNOSIS — E785 Hyperlipidemia, unspecified: Secondary | ICD-10-CM | POA: Diagnosis not present

## 2022-04-25 DIAGNOSIS — I341 Nonrheumatic mitral (valve) prolapse: Secondary | ICD-10-CM

## 2022-04-25 DIAGNOSIS — I340 Nonrheumatic mitral (valve) insufficiency: Secondary | ICD-10-CM

## 2022-04-25 DIAGNOSIS — I059 Rheumatic mitral valve disease, unspecified: Secondary | ICD-10-CM | POA: Diagnosis not present

## 2022-04-25 DIAGNOSIS — E669 Obesity, unspecified: Secondary | ICD-10-CM

## 2022-04-25 DIAGNOSIS — I10 Essential (primary) hypertension: Secondary | ICD-10-CM

## 2022-04-25 DIAGNOSIS — Z8679 Personal history of other diseases of the circulatory system: Secondary | ICD-10-CM | POA: Diagnosis not present

## 2022-04-25 DIAGNOSIS — E0590 Thyrotoxicosis, unspecified without thyrotoxic crisis or storm: Secondary | ICD-10-CM

## 2022-04-25 NOTE — Progress Notes (Signed)
Cardiology Office Note:    Date:  04/25/2022   ID:  Diana Calhoun, DOB 1956/04/10, MRN [id number]  PCP:  Seward Carol, MD   [facility] Providers Cardiologist:  Ena Dawley, MD {   Referring MD: Seward Carol, MD    History of Present Illness:    Diana Calhoun is a 66 y.o. 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 Meda Coffee who now returns to clinic for follow-up.  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 She also underwent calcium scoring which showed calcium score of 0. She had a prior echo  in 2019 that showed LVEF 55%, mild-moderate MR, mild TR, mildly elevated RVSP. 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 TEE was pursued with results below showing no prolapse but restricted posterior leaflet motion with moderate-severe MR and moderate TR. Repeat echo 10/17/20 was felt to be stable with EF 60-65%, grade 1 DD, mild-moderate LAE, posterior MV leaflet motion restricted with moderate MR without significant change from prior except no longer with elevated L or R atrial pressures.  Saw Dr. Meda Coffee in 01/2021 where she was having pleuritic chest pain with concern for pericarditis. She could not take NSAIDs due to GERD. She was started on colchicine.   Was last seen in clinic on 11/2021 where she was doing well. Chest pain had resolved and she was off colchicine. Was having some mild DOE. TTE 11/2021 showed LVEF 60-65%, normal RV, normal PASP, moderate MR, RAP 3.  Today, here today annual exam. Concerned about weight gain. Does not desire GLP-1 at this time. Is starting at healthy weight and wellness next month. Having nasal congestion with right ear fullness. Has seen PCP for this and taking singulair, occasionally using flonase. She is otherwise doing well from a  CV standpoint. No chest pain, SOB, LE edema, orthopnea or PND. Reports she had a Ca score 3 years ago and it was 0. Does not want to start cholesterol lowering therapy today.   Past Medical History:  Diagnosis Date   Body mass index (BMI) of 30.0-30.9 in adult    Familial hypercholesterolemia    GERD (gastroesophageal reflux disease)    Hyperthyroidism    Mitral valve regurgitation    MVP (mitral valve prolapse)    Pulmonary sarcoidosis (HCC)    Tricuspid regurgitation    Past Surgical History:  Procedure Laterality Date   CESAREAN SECTION     PERCARDIUM INFUSION     TEE WITHOUT CARDIOVERSION N/A 06/11/2020   Procedure: TRANSESOPHAGEAL ECHOCARDIOGRAM (TEE);  Surgeon: Josue Hector, MD;  Location: [facility] ENDOSCOPY;  Service: Cardiovascular;  Laterality: N/A;   Current Medications: Current Meds  Medication Sig   acetaminophen (TYLENOL) 500 MG tablet Take 500 mg by mouth every 6 (six) hours as needed.   albuterol (VENTOLIN HFA) 108 (90 Base) MCG/ACT inhaler Inhale 1-2 puffs into the lungs every 6 (six) hours as needed for wheezing or shortness of breath.   fluticasone furoate-vilanterol (BREO ELLIPTA) 200-25 MCG/ACT AEPB Inhale 1 puff into the lungs daily.   fluticasone furoate-vilanterol (BREO ELLIPTA) 200-25 MCG/ACT AEPB Inhale 1 puff into the lungs daily.   HYDROcodone bit-homatropine (HYCODAN) 5-1.5 MG/5ML syrup Take 5 mLs by mouth every 6 (six) hours as needed for cough.    Allergies:   Shellfish allergy and Sulfa antibiotics   Social  History   Socioeconomic History   Marital status: Single    Spouse name: Not on file   Number of children: Not on file   Years of education: Not on file   Highest education level: Not on file  Occupational History   Not on file  Tobacco Use   Smoking status: Never   Smokeless tobacco: Never  Substance and Sexual Activity   Alcohol use: Not on file   Drug use: Not on file   Sexual activity: Not on file  Other Topics Concern   Not on file   Social History Narrative   Not on file   Social Determinants of Health   Financial Resource Strain: Not on file  Food Insecurity: Not on file  Transportation Needs: Not on file  Physical Activity: Not on file  Stress: Not on file  Social Connections: Not on file     Family History: The patient's family history includes Diabetes in her sister and sister; Hypertension in her mother.  ROS:   Review of Systems  Constitutional:  Negative for chills and diaphoresis.  HENT:  Positive for congestion. Negative for hearing loss and nosebleeds.   Eyes:  Negative for double vision and pain.  Respiratory:  Positive for shortness of breath. Negative for hemoptysis and sputum production.   Cardiovascular:  Negative for chest pain, palpitations, orthopnea, claudication, leg swelling and PND.  Gastrointestinal:  Negative for diarrhea, nausea and vomiting.  Genitourinary:  Negative for dysuria and frequency.  Musculoskeletal:  Negative for falls and neck pain.  Skin:  Negative for itching.  Neurological:  Negative for dizziness, seizures and headaches.  Endo/Heme/Allergies:  Negative for polydipsia.  Psychiatric/Behavioral:  Negative for suicidal ideas. The patient has insomnia.    EKGs/Labs/Other Studies Reviewed:    The following studies were reviewed today: TTE 08-Dec-2021: IMPRESSIONS    1. Left ventricular ejection fraction, by estimation, is 60 to 65%. The  left ventricle has normal function. The left ventricle has no regional  wall motion abnormalities. Left ventricular diastolic parameters were  normal.   2. Right ventricular systolic function is normal. The right ventricular  size is normal. There is normal pulmonary artery systolic pressure.   3. Left atrial size was moderately dilated.   4. PISA 0.5 cm      ERO 0.14 cm2      MR Vol 24 ml. The mitral valve is rheumatic. Moderate mitral valve  regurgitation. No evidence of mitral stenosis. There is mild holosystolic  prolapse of  the middle segment of the anterior leaflet of the mitral  valve.   5. The aortic valve is normal in structure. Aortic valve regurgitation is  not visualized. No aortic stenosis is present.   6. The inferior vena cava is normal in size with greater than 50%  respiratory variability, suggesting right atrial pressure of 3 mmHg.   Echo 10/17/20: 1. Left ventricular ejection fraction, by estimation, is 60 to 65%. The  left ventricle has normal function. The left ventricle has no regional  wall motion abnormalities. Left ventricular diastolic parameters are  consistent with Grade I diastolic  dysfunction (impaired relaxation).   2. Right ventricular systolic function is normal. The right ventricular  size is normal. Tricuspid regurgitation signal is inadequate for assessing  PA pressure.   3. Left atrial size was mild to moderately dilated.   4. Mitral valve changes suggest rheumatic or other postinflammatory  change. The posterior leaflet motion is restricted. The mitral  regurgitation jet is very eccentric  and its severity may be  underestimated. Moderate mitral valve regurgitation. No  evidence of mitral stenosis.   5. The aortic valve is normal in structure. Aortic valve regurgitation is  not visualized. No aortic stenosis is present.   6. The inferior vena cava is normal in size with greater than 50%  respiratory variability, suggesting right atrial pressure of 3 mmHg.   Comparison(s): Prior images reviewed side by side. There is no significant  change in left ventricular systolic function or mitral valve findings, but  there is no longer evidence of elevated left or right atrial pressures.   CT Chest 09/12/2020:  FINDINGS: Cardiovascular: Heart size is normal. No pericardial effusion. Aortic caliber is normal. Central pulmonary vasculature on venous phase assessment is unremarkable.   Mediastinum/Nodes: Bulky adenopathy, bi hilar, subcarinal and RIGHT paratracheal.   RIGHT  paratracheal lymph node (image 45, series 2) 16 mm short axis.   (Image 61, series 2) 20 mm short axis subcarinal lymph node.   (Image 75, series 2) 14 mm subcarinal nodal enlargement.   (Image 59, series 2) 26 mm RIGHT hilar lymph node.   LEFT hilar lymph nodes also enlarged but less enlarged than RIGHT hilar lymph nodes.   Smaller pre-vascular lymph nodes less than a cm. No frank adenopathy in the anterior mediastinum. No thoracic inlet adenopathy. No axillary lymphadenopathy.   Lungs/Pleura: No consolidation.  No pleural effusion.   Ground-glass nodule in the LEFT lung base (image 111, series 3) 9 x 8 mm. Limited assessment of this area due to motion artifact. Airways are patent.   Upper Abdomen: Incidental imaging of upper abdominal contents is unremarkable. Adrenal glands are normal. No upper abdominal lymphadenopathy.   Musculoskeletal: No acute musculoskeletal process. No destructive bone process finding.   IMPRESSION: 1. Bulky adenopathy, bihilar, subcarinal and RIGHT paratracheal lymph nodes. Pattern of disease is compatible with provided history of sarcoidosis. Given that this is a diagnosis of exclusion would ensure correlation with prior pathology results. No prior imaging is available for review. 2. No septal thickening, bronchiectasis or peribronchovascular nodularity. No acute cardiopulmonary findings. 3. Ground-glass nodule in the LEFT lung base measuring 9 mm mean diameter. Mildly limited assessment due to motion artifact. Initial follow-up with CT at 6-12 months is recommended to confirm persistence. If persistent, repeat CT is recommended every 2 years until 5 years of stability has been established. This recommendation follows the consensus statement: Guidelines for Management of Incidental Pulmonary Nodules Detected on CT Images: From the Fleischner Society 2017; Radiology 2017; 284:228-243. 4. Aortic atherosclerosis.   Aortic Atherosclerosis  (ICD10-I70.0).  06/11/20 TEE: IMPRESSIONS    1. Left ventricular ejection fraction, by estimation, is 60 to 65%. The  left ventricle has normal function. The left ventricle has no regional  wall motion abnormalities.   2. Right ventricular systolic function is normal. The right ventricular  size is normal. There is normal pulmonary artery systolic pressure.   3. Left atrial size was moderately dilated. No left atrial/left atrial  appendage thrombus was detected.   4. No prolapse. Carpentier type 3A restricted posterior leaflet motion  Pisa Radius 0.8 , ERO .39 and RV 55 suggest moderate to severe disease. 3D  VC 0.87 suggests worse but there appears to be two MR jets one central and  one lateral which makes  assessing 3D VC difficult There was blunted systolic forward flow in the  PV;s but clearly no reversal . The mitral valve is abnormal. Moderate to  severe mitral valve regurgitation. No  evidence of mitral stenosis.   5. Tricuspid valve regurgitation is moderate.   6. The aortic valve is tricuspid. Aortic valve regurgitation is not  visualized. No aortic stenosis is present.   7. The inferior vena cava is normal in size with greater than 50%  respiratory variability, suggesting right atrial pressure of 3 mmHg.   Conclusion(s)/Recommendation(s): Normal biventricular function without  evidence of hemodynamically significant valvular heart disease.   EKG:   11/19/2021: Sinus rhythm. Rate 93 bpm.  Recent Labs: No results found for requested labs within last 365 days.   Recent Lipid Panel    Component Value Date/Time   CHOL 181 05/31/2020 0818   TRIG 63 05/31/2020 0818   HDL 55 05/31/2020 0818   CHOLHDL 3.3 05/31/2020 0818   LDLCALC 114 (H) 05/31/2020 0818     Risk Assessment/Calculations:           Physical Exam:    VS:  BP 118/68   Pulse 79   Ht 5' 5.5" (1.664 m)   Wt 243 lb (110.2 kg)   SpO2 99%   BMI 39.82 kg/m     Wt Readings from Last 3 Encounters:   04/25/22 243 lb (110.2 kg)  11/19/21 232 lb 9.6 oz (105.5 kg)  09/19/21 229 lb 6.4 oz (104.1 kg)     GEN: Well nourished, well developed in no acute distress HEENT: Normal NECK: No JVD; No carotid bruits CARDIAC: RRR, 2/6 blowing systolic murmur RESPIRATORY:  Clear to auscultation without rales, wheezing or rhonchi  MUSCULOSKELETAL:  No edema; No deformity  SKIN: Warm and dry NEUROLOGIC:  Alert and oriented x 3 PSYCHIATRIC:  Normal affect   ASSESSMENT:    1. History of pericarditis   2. Mitral valve disease   3. MVP (mitral valve prolapse)   4. Hyperthyroidism   5. Hyperlipidemia, unspecified hyperlipidemia type   6. Obesity (BMI 30-39.9)   7. Moderate mitral valve regurgitation   8. Essential hypertension    PLAN:    In order of problems listed above:  #Recurrent Pericarditis: Last episode 01/2021. Resolved with colchicine. No recurrence since that time. 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 Normal LVEF 60-65%. Normal RV. Normal PASP.  -Continue serial monitoring with repeat TTEs every year with next 11/2022  #Hyperthyroidism s/p RAI: -Follow-up with endocrinology as scheduled  #HLD: Ca score 0 in [place] 3 years ago. Did not tolerate statins and declined starting cholesterol lowering therapy today. Can repeat Ca score in 2 years for monitoring. Will repeat lipids. -Check lipid panel -Ca score 0 about 3 years ago; can repeat in 2 years -Has not tolerated statins and declined cholesterol lowering therapy for now -Lifestyle modifications  #Obesity: BMI 39.8. Working on lifestyle modifications. -Lifestyle modifications as detailed below -Plans to see weight loss clinic  #Seasonal allergies: -Start zyrtec, flonase in the morning -Continue singulair -F/u w/ PCP  Exercise recommendations: Goal of exercising for  at least 30 minutes a day, at least 5 times per week.  Please exercise to a moderate exertion.  This means that while exercising it is difficult to speak in full sentences, however you are not so short of breath that you feel you must stop, and not so comfortable that you can carry on a full conversation.  Exertion level should be approximately a 5/10, if 10 is the most exertion you can perform.  Diet recommendations: Recommend a heart  healthy diet such as the Mediterranean diet.  This diet consists of plant based foods, healthy fats, lean meats, olive oil.  It suggests limiting the intake of simple carbohydrates such as white breads, pastries, and pastas.  It also limits the amount of red meat, wine, and dairy products such as cheese that one should consume on a daily basis.          Follow-up:  6 months  Medication Adjustments/Labs and Tests Ordered: Current medicines are reviewed at length with the patient today.  Concerns regarding medicines are outlined above.   Orders Placed This Encounter  Procedures   Lipid Profile   No orders of the defined types were placed in this encounter.  Patient Instructions  Medication Instructions:   Your physician recommends that you continue on your current medications as directed. Please refer to the Current Medication list given to you today.  *If you need a refill on your cardiac medications before your next appointment, please call your pharmacy*   Lab Work:  NEXT [facility] OFFICE--LIPIDS--PLEASE COME FASTING TO THIS LAB APPOINTMENT  If you have labs (blood work) drawn today and your tests are completely normal, you will receive your results only by: [place] (if you have MyChart) OR A paper copy in the mail If you have any lab test that is abnormal or we need to change your treatment, we will call you to review the results.   Follow-Up: At [facility], you and your health needs are our priority.  As part of our  continuing mission to provide you with exceptional heart care, we have created designated Provider Care Teams.  These Care Teams include your primary Cardiologist (physician) and Advanced Practice Providers (APPs -  Physician Assistants and Nurse Practitioners) who all work together to provide you with the care you need, when you need it.  We recommend signing up for the patient portal called "MyChart".  Sign up information is provided on this After Visit Summary.  MyChart is used to connect with patients for Virtual Visits (Telemedicine).  Patients are able to view lab/test results, encounter notes, upcoming appointments, etc.  Non-urgent messages can be sent to your provider as well.   To learn more about what you can do with MyChart, go to [email or web address].    Your next appointment:   6 month(s)  The format for your next appointment:   In Person  Provider:   DR. Johney Frame   Important Information About Sugar         Signed, Freada Bergeron, MD  04/25/2022 11:57 AM    [place]

## 2022-04-25 NOTE — Patient Instructions (Signed)
Medication Instructions:   Your physician recommends that you continue on your current medications as directed. Please refer to the Current Medication list given to you today.  *If you need a refill on your cardiac medications before your next appointment, please call your pharmacy*   Lab Work:  NEXT WEEK HERE IN THE OFFICE--LIPIDS--PLEASE COME FASTING TO THIS LAB APPOINTMENT  If you have labs (blood work) drawn today and your tests are completely normal, you will receive your results only by: MyChart Message (if you have MyChart) OR A paper copy in the mail If you have any lab test that is abnormal or we need to change your treatment, we will call you to review the results.   Follow-Up: At [facility], you and your health needs are our priority.  As part of our continuing mission to provide you with exceptional heart care, we have created designated Provider Care Teams.  These Care Teams include your primary Cardiologist (physician) and Advanced Practice Providers (APPs -  Physician Assistants and Nurse Practitioners) who all work together to provide you with the care you need, when you need it.  We recommend signing up for the patient portal called "MyChart".  Sign up information is provided on this After Visit Summary.  MyChart is used to connect with patients for Virtual Visits (Telemedicine).  Patients are able to view lab/test results, encounter notes, upcoming appointments, etc.  Non-urgent messages can be sent to your provider as well.   To learn more about what you can do with MyChart, go to [email or web address].    Your next appointment:   6 month(s)  The format for your next appointment:   In Person  Provider:   DR. Shari Prows   Important Information About Sugar

## 2022-04-29 ENCOUNTER — Other Ambulatory Visit: Payer: No Typology Code available for payment source

## 2022-04-29 DIAGNOSIS — I059 Rheumatic mitral valve disease, unspecified: Secondary | ICD-10-CM | POA: Diagnosis not present

## 2022-04-29 DIAGNOSIS — Z8679 Personal history of other diseases of the circulatory system: Secondary | ICD-10-CM

## 2022-04-29 DIAGNOSIS — I341 Nonrheumatic mitral (valve) prolapse: Secondary | ICD-10-CM | POA: Diagnosis not present

## 2022-04-29 DIAGNOSIS — E0590 Thyrotoxicosis, unspecified without thyrotoxic crisis or storm: Secondary | ICD-10-CM | POA: Diagnosis not present

## 2022-04-29 DIAGNOSIS — E785 Hyperlipidemia, unspecified: Secondary | ICD-10-CM

## 2022-04-29 DIAGNOSIS — E669 Obesity, unspecified: Secondary | ICD-10-CM

## 2022-04-29 LAB — LIPID PANEL
Chol/HDL Ratio: 3.2 ratio (ref 0.0–4.4)
Cholesterol, Total: 185 mg/dL (ref 100–199)
HDL: 57 mg/dL (ref >39)
LDL Chol Calc (NIH): 115 mg/dL — ABNORMAL HIGH (ref 0–99)
Triglycerides: 69 mg/dL (ref 0–149)
VLDL Cholesterol Cal: 13 mg/dL (ref 5–40)

## 2022-05-22 DIAGNOSIS — R0602 Shortness of breath: Secondary | ICD-10-CM | POA: Diagnosis not present

## 2022-05-22 DIAGNOSIS — R5383 Other fatigue: Secondary | ICD-10-CM | POA: Diagnosis not present

## 2022-05-22 DIAGNOSIS — E7800 Pure hypercholesterolemia, unspecified: Secondary | ICD-10-CM | POA: Diagnosis not present

## 2022-05-22 DIAGNOSIS — E079 Disorder of thyroid, unspecified: Secondary | ICD-10-CM | POA: Diagnosis not present

## 2022-05-22 DIAGNOSIS — R948 Abnormal results of function studies of other organs and systems: Secondary | ICD-10-CM | POA: Diagnosis not present

## 2022-05-22 DIAGNOSIS — D869 Sarcoidosis, unspecified: Secondary | ICD-10-CM | POA: Diagnosis not present

## 2022-05-22 DIAGNOSIS — R7303 Prediabetes: Secondary | ICD-10-CM | POA: Diagnosis not present

## 2022-05-22 DIAGNOSIS — Z79899 Other long term (current) drug therapy: Secondary | ICD-10-CM | POA: Diagnosis not present

## 2022-05-22 DIAGNOSIS — K219 Gastro-esophageal reflux disease without esophagitis: Secondary | ICD-10-CM | POA: Diagnosis not present

## 2022-05-22 DIAGNOSIS — Z1331 Encounter for screening for depression: Secondary | ICD-10-CM | POA: Diagnosis not present

## 2022-05-22 DIAGNOSIS — M1990 Unspecified osteoarthritis, unspecified site: Secondary | ICD-10-CM | POA: Diagnosis not present

## 2022-05-22 DIAGNOSIS — D61818 Other pancytopenia: Secondary | ICD-10-CM | POA: Diagnosis not present

## 2022-05-29 ENCOUNTER — Ambulatory Visit: Payer: No Typology Code available for payment source | Admitting: Emergency Medicine

## 2022-06-05 DIAGNOSIS — E785 Hyperlipidemia, unspecified: Secondary | ICD-10-CM | POA: Diagnosis not present

## 2022-06-05 DIAGNOSIS — D61818 Other pancytopenia: Secondary | ICD-10-CM | POA: Diagnosis not present

## 2022-06-05 DIAGNOSIS — D519 Vitamin B12 deficiency anemia, unspecified: Secondary | ICD-10-CM | POA: Diagnosis not present

## 2022-06-05 DIAGNOSIS — R7303 Prediabetes: Secondary | ICD-10-CM | POA: Diagnosis not present

## 2022-06-05 DIAGNOSIS — R948 Abnormal results of function studies of other organs and systems: Secondary | ICD-10-CM | POA: Diagnosis not present

## 2022-06-05 DIAGNOSIS — R300 Dysuria: Secondary | ICD-10-CM | POA: Diagnosis not present

## 2022-06-05 DIAGNOSIS — R5383 Other fatigue: Secondary | ICD-10-CM | POA: Diagnosis not present

## 2022-06-05 DIAGNOSIS — Z8639 Personal history of other endocrine, nutritional and metabolic disease: Secondary | ICD-10-CM | POA: Diagnosis not present

## 2022-06-05 DIAGNOSIS — E0500 Thyrotoxicosis with diffuse goiter without thyrotoxic crisis or storm: Secondary | ICD-10-CM | POA: Diagnosis not present

## 2022-06-05 DIAGNOSIS — F5081 Binge eating disorder: Secondary | ICD-10-CM | POA: Diagnosis not present

## 2022-06-11 DIAGNOSIS — R7309 Other abnormal glucose: Secondary | ICD-10-CM | POA: Diagnosis not present

## 2022-06-11 DIAGNOSIS — E0500 Thyrotoxicosis with diffuse goiter without thyrotoxic crisis or storm: Secondary | ICD-10-CM | POA: Diagnosis not present

## 2022-06-17 DIAGNOSIS — D519 Vitamin B12 deficiency anemia, unspecified: Secondary | ICD-10-CM | POA: Diagnosis not present

## 2022-06-17 DIAGNOSIS — E559 Vitamin D deficiency, unspecified: Secondary | ICD-10-CM | POA: Diagnosis not present

## 2022-06-17 DIAGNOSIS — R7303 Prediabetes: Secondary | ICD-10-CM | POA: Diagnosis not present

## 2022-06-17 DIAGNOSIS — D7589 Other specified diseases of blood and blood-forming organs: Secondary | ICD-10-CM | POA: Diagnosis not present

## 2022-06-17 DIAGNOSIS — M2550 Pain in unspecified joint: Secondary | ICD-10-CM | POA: Diagnosis not present

## 2022-06-17 DIAGNOSIS — F321 Major depressive disorder, single episode, moderate: Secondary | ICD-10-CM | POA: Diagnosis not present

## 2022-07-11 ENCOUNTER — Encounter: Payer: Self-pay | Admitting: Registered"

## 2022-07-11 ENCOUNTER — Encounter: Payer: No Typology Code available for payment source | Attending: Internal Medicine | Admitting: Registered"

## 2022-07-11 DIAGNOSIS — R7303 Prediabetes: Secondary | ICD-10-CM | POA: Insufficient documentation

## 2022-07-11 NOTE — Progress Notes (Signed)
Patient was seen on 07/11/2022 for the Core Session 1 of Diabetes Prevention Program course at [facility]. The following learning objectives were met by the patient during this class:   Learning Objectives:  Be able to explain the purpose and benefits of the National Diabetes Prevention Program.  Be able to describe the events that will take place at every session.  Know the weight loss and physical activity goals established by the [facility] Diabetes Prevention Program.  Know their own individual weight loss and physical activity goals.  Be able to explain the important effect of self-monitoring on behavior change.   Goals:  Record food and beverage intake in "Food and Activity Tracker" over the next week.  Bring completed "Food and Activity Tracker" for session 1 to session 2 next week. Circle the foods or beverages you think are highest in fat and calories in your food tracker. Read the labels on the food you buy, and consider using measuring cups and spoons to help you calculate the amount you eat. We will talk about measuring in more detail in the coming weeks.   Follow-Up Plan: Attend Core Session 2 next week.  Bring completed "Food and Activity Tracker" next week to be reviewed by Lifestyle Coach.

## 2022-07-17 ENCOUNTER — Ambulatory Visit: Payer: No Typology Code available for payment source | Admitting: Emergency Medicine

## 2022-07-18 ENCOUNTER — Encounter: Payer: Self-pay | Admitting: Registered"

## 2022-07-18 ENCOUNTER — Encounter: Payer: No Typology Code available for payment source | Attending: Internal Medicine | Admitting: Registered"

## 2022-07-18 DIAGNOSIS — R7303 Prediabetes: Secondary | ICD-10-CM | POA: Insufficient documentation

## 2022-07-18 NOTE — Progress Notes (Signed)
Patient was seen on 07/18/22 for the Core Session 2 of Diabetes Prevention Program course at [facility]. By the end of this session patients are able to complete the following objectives:   Learning Objectives: Self-monitor their weight during the weeks following Session 2.  Describe the relationship between fat and calories.  Explain the reason for, and basic principles of, self-monitoring fat grams and calories.  Identify their personal fat gram goals.  Use the ?Fat and Calorie Counter? to calculate the calories and fat grams of a given selection of foods.  Keep a running total of the fat grams they eat each day.  Calculate fat, calories, and serving sizes from nutrition labels.   Goals:  Weigh yourself at the same time each day, or every few days, and record your weight in your Food and Activity Tracker. Write down everything you eat and drink in your Food and Activity Tracker. Measure portions as much as you can, and start reading labels.  Use the ?Fat and Calorie Counter? to figure out the amount of fat and calories in what you ate, and write the amount down in your Food and Activity Tracker. Keep a running fat gram total throughout the day. Come as close to your fat gram goal as you can.   Follow-Up Plan: Attend Core Session 3 next week.  Bring completed "Food and Activity Tracker" next week to be reviewed by Lifestyle Coach.   

## 2022-07-21 IMAGING — CT CT CHEST W/ CM
2 of 4 series · 15 of 36 positions shown, 18 images · IV contrast (OMNIPAQUE 300)
Comparison: None

CLINICAL DATA: History of sarcoidosis for 20 years

EXAM:
CT CHEST WITH CONTRAST
TECHNIQUE: Multidetector CT imaging of the chest was performed during
intravenous contrast administration.
CONTRAST:  80mL OMNIPAQUE IOHEXOL 300 MG/ML  SOLN

[Series 2: thorax · axial · 0.71mm/px · z∈[-296,-40]mm · 12 of 152 slices shown, 15 images]
[im 12/152  mediastinal]
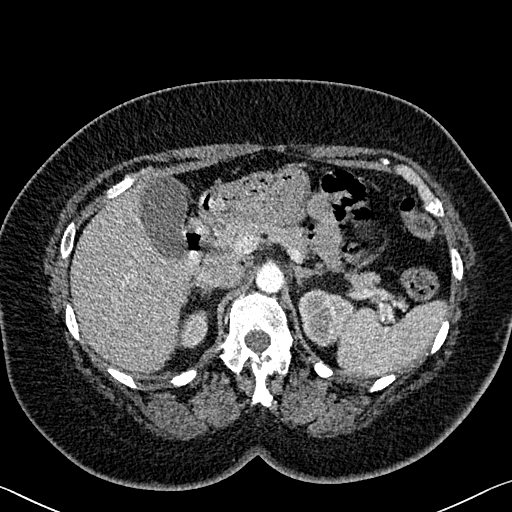
[im 12/152  lung]
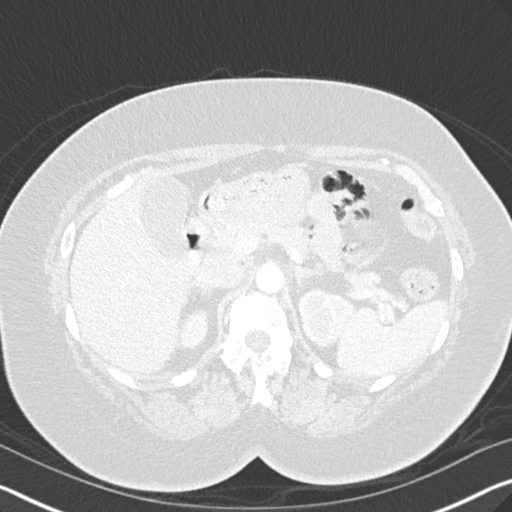
[im 24/152  lung]
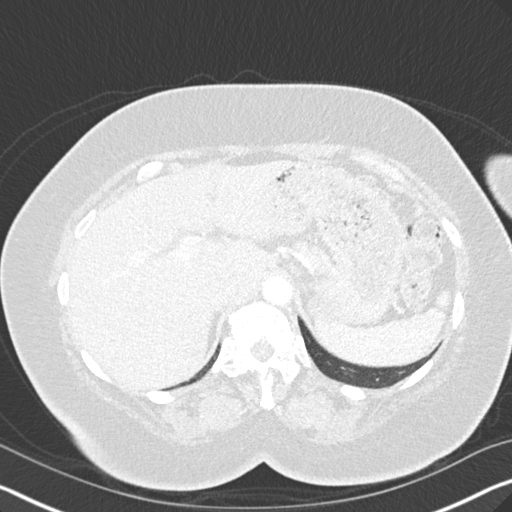
[im 35/152  lung]
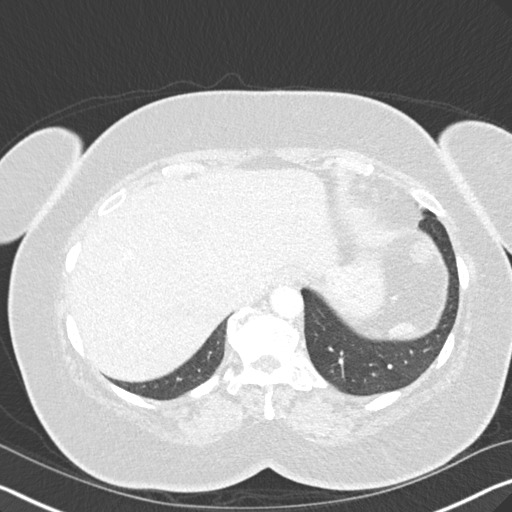
[im 47/152  lung]
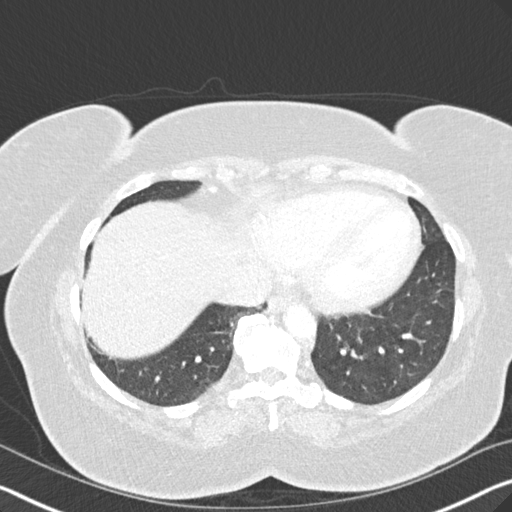
[im 59/152  mediastinal]
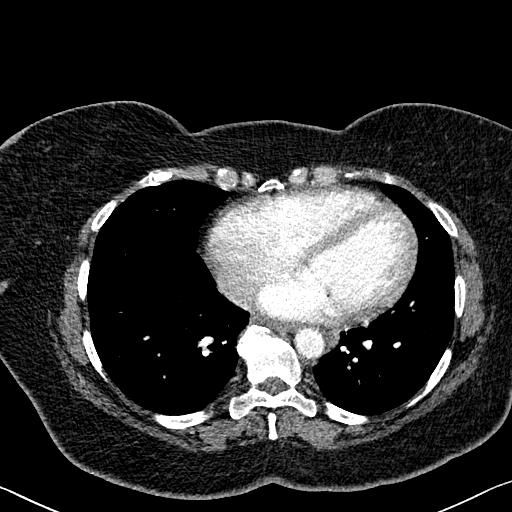
[im 59/152  lung]
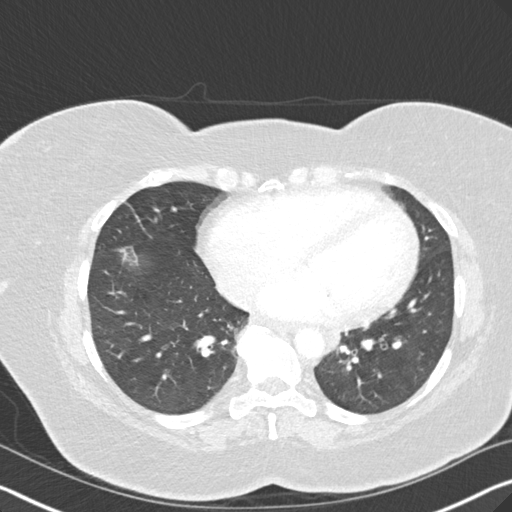
[im 70/152  lung]
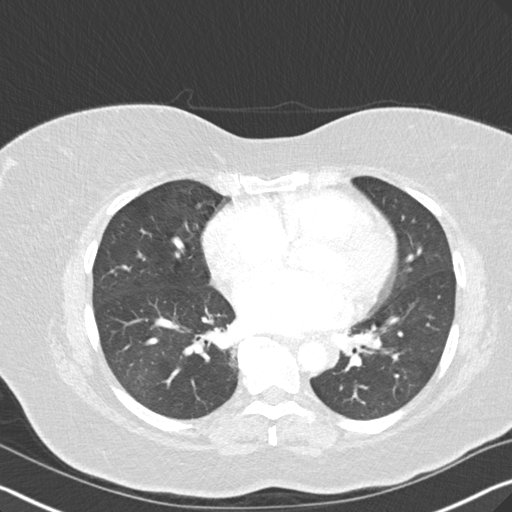
[im 82/152  lung]
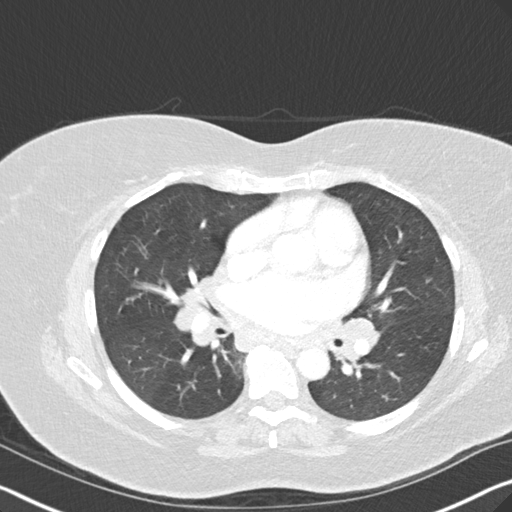
[im 93/152  lung]
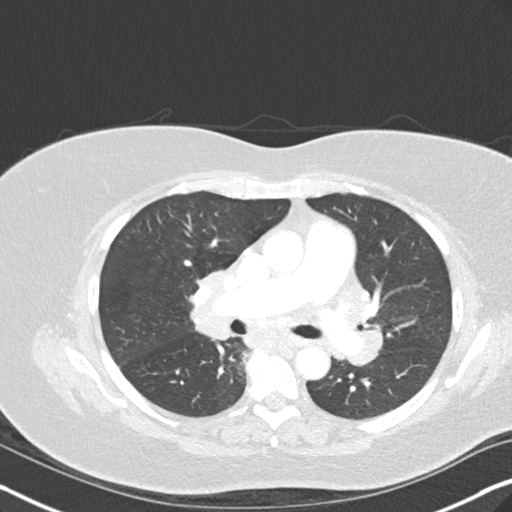
[im 105/152  mediastinal]
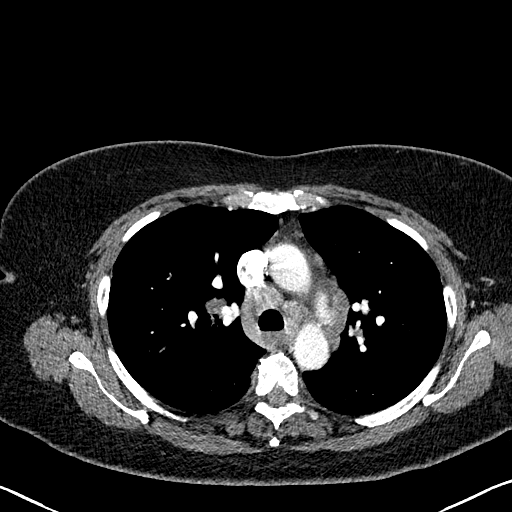
[im 105/152  lung]
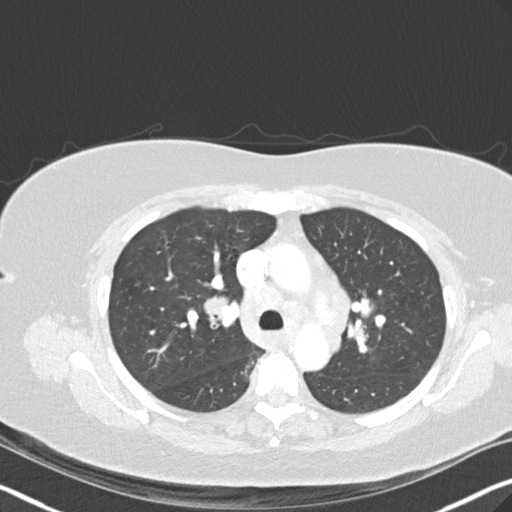
[im 117/152  lung]
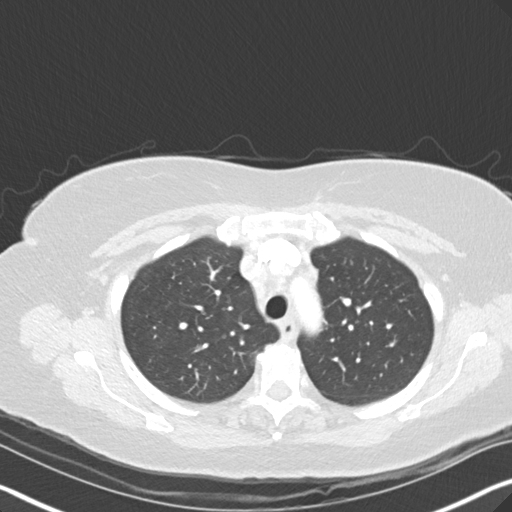
[im 128/152  lung]
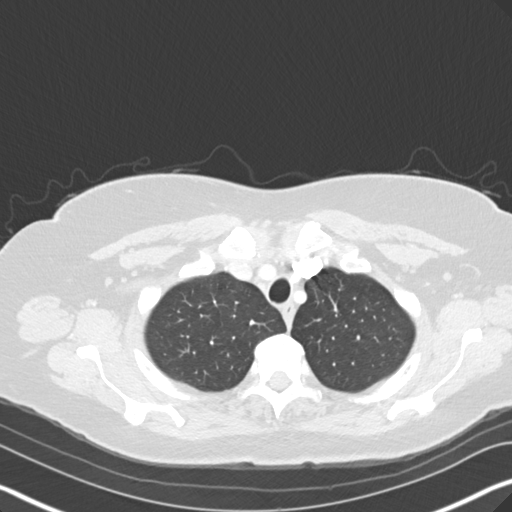
[im 140/152  lung]
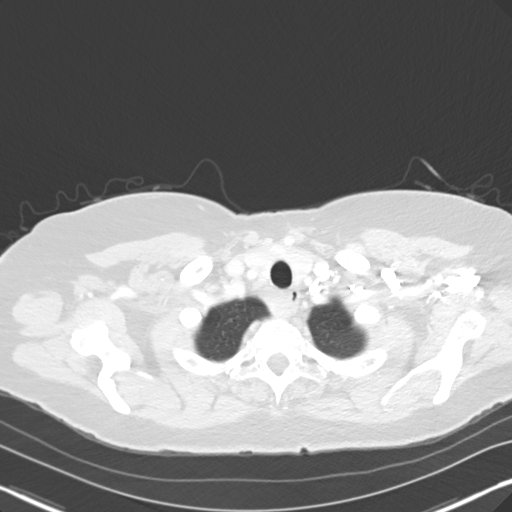

[Series 5: coronal · coronal · 0.59mm/px · 3 of 107 slices shown]
[im 22/107  lung]
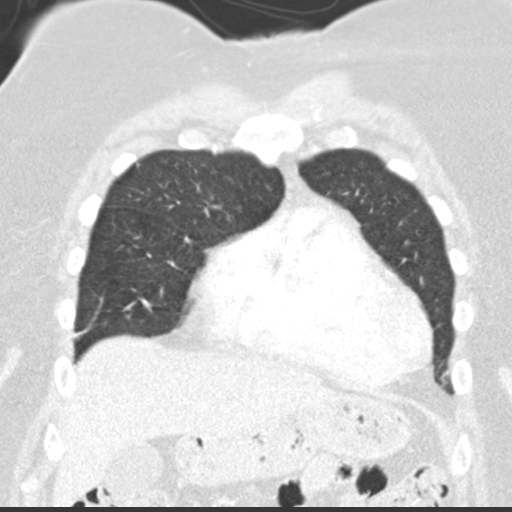
[im 43/107  lung]
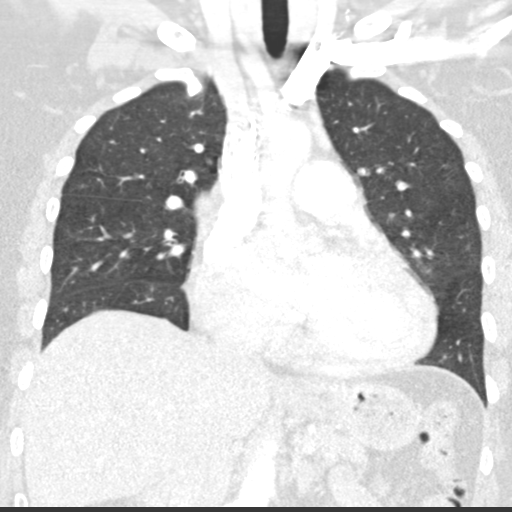
[im 64/107  lung]
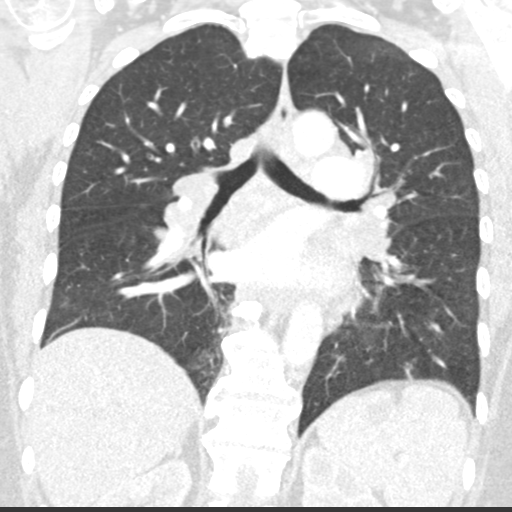

[15 of 36 positions shown; findings below may reference images not displayed]

FINDINGS: Cardiovascular: Heart size is normal. No pericardial effusion.
Aortic caliber is normal. Central pulmonary vasculature on venous
phase assessment is unremarkable.

Mediastinum/Nodes: Bulky adenopathy, bi hilar, subcarinal and RIGHT
paratracheal.

RIGHT paratracheal lymph node (image 45, series 2) 16 mm short axis.

(Image 61, series 2) 20 mm short axis subcarinal lymph node.

(Image 75, series 2) 14 mm subcarinal nodal enlargement.

(Image 59, series 2) 26 mm RIGHT hilar lymph node.

LEFT hilar lymph nodes also enlarged but less enlarged than RIGHT
hilar lymph nodes.

Smaller pre-vascular lymph nodes less than a cm. No frank adenopathy
in the anterior mediastinum. No thoracic inlet adenopathy. No
axillary lymphadenopathy.

Lungs/Pleura: No consolidation.  No pleural effusion.

Ground-glass nodule in the LEFT lung base (image 111, series [DATE] x
8 mm. Limited assessment of this area due to motion artifact.
Airways are patent.

Upper Abdomen: Incidental imaging of upper abdominal contents is
unremarkable. Adrenal glands are normal. No upper abdominal
lymphadenopathy.

Musculoskeletal: No acute musculoskeletal process. No destructive
bone process finding.
IMPRESSION: 1. Bulky adenopathy, bihilar, subcarinal and RIGHT paratracheal
lymph nodes. Pattern of disease is compatible with provided history
of sarcoidosis. Given that this is a diagnosis of exclusion would
ensure correlation with prior pathology results. No prior imaging is
available for review.
2. No septal thickening, bronchiectasis or peribronchovascular
nodularity. No acute cardiopulmonary findings.
3. Ground-glass nodule in the LEFT lung base measuring 9 mm mean
diameter. Mildly limited assessment due to motion artifact. Initial
follow-up with CT at 6-12 months is recommended to confirm
persistence. If persistent, repeat CT is recommended every 2 years
until 5 years of stability has been established. This recommendation
follows the consensus statement: Guidelines for Management of
Incidental Pulmonary Nodules Detected on CT Images: From the
4. Aortic atherosclerosis.

Aortic Atherosclerosis ([id number].O).

## 2022-07-22 ENCOUNTER — Ambulatory Visit: Payer: No Typology Code available for payment source | Admitting: Emergency Medicine

## 2022-07-22 ENCOUNTER — Encounter: Payer: Self-pay | Admitting: Emergency Medicine

## 2022-07-22 DIAGNOSIS — D869 Sarcoidosis, unspecified: Secondary | ICD-10-CM

## 2022-07-22 DIAGNOSIS — J301 Allergic rhinitis due to pollen: Secondary | ICD-10-CM

## 2022-07-22 DIAGNOSIS — J449 Chronic obstructive pulmonary disease, unspecified: Secondary | ICD-10-CM

## 2022-07-22 DIAGNOSIS — K219 Gastro-esophageal reflux disease without esophagitis: Secondary | ICD-10-CM

## 2022-07-22 DIAGNOSIS — J309 Allergic rhinitis, unspecified: Secondary | ICD-10-CM | POA: Insufficient documentation

## 2022-07-22 MED ORDER — FLUTICASONE PROPIONATE 50 MCG/ACT NA SUSP: 2.0000 | Freq: Every day | NASAL | 5 refills | Status: DC

## 2022-07-22 MED ORDER — PANTOPRAZOLE SODIUM 40 MG PO TBEC: DELAYED_RELEASE_TABLET | ORAL | 5 refills | Status: DC

## 2022-07-22 NOTE — Addendum Note (Signed)
Addended by: Dorisann Frames R on: 07/22/2022 10:49 AM   Modules accepted: Orders

## 2022-07-22 NOTE — Progress Notes (Signed)
Subjective:    Patient ID: Diana Calhoun, female    DOB: 19-Sep-1956, 66 y.o.   MRN: [id number]  HPI  ROV 12/17/20 --66 year old woman never smoker with a history of sarcoidosis with cardiac involvement, significant mediastinal lymphadenopathy.  Also with a history of GERD, chronic rhinitis, hyperlipidemia, MR.  She has been dealing with chronic cough that has been most consistent with upper airway irritation.  It was exacerbated somewhat by Virgel Bouquet - she is now back on this.  She been treated with pantoprazole, fluticasone nasal spray, Singulair. She was just dx with hyperthyroidism since last time, is on methimazole and is dealing with constipation.  She c/o mid sternal pain, worse w eating, on pantoprazole.   She has some intermittent dyspnea even at rest. Rarely uses albuterol due to tremor. She has benefited from being in the [place].   She underwent pulmonary function testing today which I have reviewed, shows severe obstruction with a positive bronchodilator response, normal lung volumes and normal diffusion capacity when corrected for alveolar volume.  ROV 07/22/2022 --follow-up visit 66 year old woman with history of sarcoidosis with cardiac involvement, significant mediastinal adenopathy, severe obstructive disease with a positive bronchodilator response.  She has chronic cough with upper airway irritation in setting of GERD, chronic rhinitis.  She is being treated for hyperthyroidism.  Her most recent CT chest was 09/12/2020. She is hearing wheeze in the mornings for the last month, more exertional SOB especially in the heat. She has more nasal congestion and ear full ness. She is on singulair every evening, has fluticasone NS that she uses prn. She is on Breo. Occasional albuterol use, not every day. She does intermittently get some Mccarter bumps/rash on her legs and back, pruritic. She is due for a repeat TTE in 11/2022. Her Ct chest was deferred due to cost.   Review of Systems As per HPI       Objective:   Physical Exam  Vitals:   07/22/22 1003  BP: 132/80  Pulse: 72  Temp: 98 F (36.7 C)  TempSrc: Oral  SpO2: 98%  Weight: 244 lb 6.4 oz (110.9 kg)  Height: 5\' 5"  (1.651 m)   Gen: Pleasant, overwt woman, in no distress,  normal affect  ENT: No lesions,  mouth clear,  oropharynx clear, no postnasal drip  Neck: No JVD, no stridor  Lungs: No use of accessory muscles, no crackles or wheezing on normal respiration, no wheeze on forced expiration  Cardiovascular: RRR, heart sounds normal, no murmur or gallops, no peripheral edema  Musculoskeletal: No deformities, no cyanosis or clubbing  Neuro: alert, awake, non focal  Skin: Warm, no rash     Assessment & Plan:  Sarcoidosis We will arrange for a repeat CT scan of the chest without contrast in October to follow your sarcoidosis. We will arrange to repeat your pulmonary function testing at some point going forward.  Will not schedule for now Follow with Dr November in October after your CT scan so we can review the results together.  Asthma with COPD (HCC) Please continue Breo once daily.  Rinse and gargle after using. Keep your albuterol available to use 2 puffs if needed for shortness of breath, chest tightness, wheezing.  We will refill this for you today.  GERD (gastroesophageal reflux disease) We will refill your pantoprazole 40 mg once daily.  Take this medication 1 hour around food.   Allergic rhinitis Continue Singulair 10 mg each evening.  We will refill this today. Continue fluticasone nasal spray, 2  sprays each nostril once daily.  We will refill this today Try starting loratadine 10 mg (generic Claritin) once daily for the next month to see if this helps your congestion during the allergy season.  You may want to continue in the spring and fall months  Levy Pupa, MD, PhD 07/22/2022, 10:26 AM [facility] [phone] or if no answer [phone]

## 2022-07-22 NOTE — Addendum Note (Signed)
Addended by: Dorisann Frames R on: 07/22/2022 10:45 AM   Modules accepted: Orders

## 2022-07-22 NOTE — Assessment & Plan Note (Signed)
We will refill your pantoprazole 40 mg once daily.  Take this medication 1 hour around food.

## 2022-07-22 NOTE — Assessment & Plan Note (Signed)
Please continue Breo once daily.  Rinse and gargle after using. Keep your albuterol available to use 2 puffs if needed for shortness of breath, chest tightness, wheezing.  We will refill this for you today.

## 2022-07-22 NOTE — Assessment & Plan Note (Signed)
We will arrange for a repeat CT scan of the chest without contrast in October to follow your sarcoidosis. We will arrange to repeat your pulmonary function testing at some point going forward.  Will not schedule for now Follow with Dr Delton Coombes in October after your CT scan so we can review the results together.

## 2022-07-22 NOTE — Patient Instructions (Signed)
We will arrange for a repeat CT scan of the chest without contrast in October to follow your sarcoidosis. We will arrange to repeat your pulmonary function testing at some point going forward.  Will not schedule for now Please continue Breo once daily.  Rinse and gargle after using. Keep your albuterol available to use 2 puffs if needed for shortness of breath, chest tightness, wheezing.  We will refill this for you today. Continue Singulair 10 mg each evening.  We will refill this today. Continue fluticasone nasal spray, 2 sprays each nostril once daily.  We will refill this today Try starting loratadine 10 mg (generic Claritin) once daily for the next month to see if this helps your congestion during the allergy season.  You may want to continue in the spring and fall months We will refill your pantoprazole 40 mg once daily.  Take this medication 1 hour around food. Follow with Dr Delton Coombes in October after your CT scan so we can review the results together.

## 2022-07-22 NOTE — Assessment & Plan Note (Signed)
Continue Singulair 10 mg each evening.  We will refill this today. Continue fluticasone nasal spray, 2 sprays each nostril once daily.  We will refill this today Try starting loratadine 10 mg (generic Claritin) once daily for the next month to see if this helps your congestion during the allergy season.  You may want to continue in the spring and fall months

## 2022-07-23 DIAGNOSIS — I700 Atherosclerosis of aorta: Secondary | ICD-10-CM | POA: Diagnosis not present

## 2022-07-23 DIAGNOSIS — Z008 Encounter for other general examination: Secondary | ICD-10-CM | POA: Diagnosis not present

## 2022-07-23 DIAGNOSIS — Z6841 Body Mass Index (BMI) 40.0 and over, adult: Secondary | ICD-10-CM | POA: Diagnosis not present

## 2022-07-23 DIAGNOSIS — K219 Gastro-esophageal reflux disease without esophagitis: Secondary | ICD-10-CM | POA: Diagnosis not present

## 2022-07-23 DIAGNOSIS — D860 Sarcoidosis of lung: Secondary | ICD-10-CM | POA: Diagnosis not present

## 2022-07-23 DIAGNOSIS — F324 Major depressive disorder, single episode, in partial remission: Secondary | ICD-10-CM | POA: Diagnosis not present

## 2022-07-23 DIAGNOSIS — E785 Hyperlipidemia, unspecified: Secondary | ICD-10-CM | POA: Diagnosis not present

## 2022-07-23 DIAGNOSIS — J449 Chronic obstructive pulmonary disease, unspecified: Secondary | ICD-10-CM | POA: Diagnosis not present

## 2022-07-25 ENCOUNTER — Encounter: Payer: Self-pay | Admitting: Dietician

## 2022-07-25 ENCOUNTER — Encounter: Payer: No Typology Code available for payment source | Attending: Internal Medicine | Admitting: Dietician

## 2022-07-25 DIAGNOSIS — R7303 Prediabetes: Secondary | ICD-10-CM | POA: Insufficient documentation

## 2022-07-25 NOTE — Progress Notes (Signed)
Patient was seen on 07/25/2022 for the Core Session 3 of Diabetes Prevention Program course at [facility]. By the end of this session patients are able to complete the following objectives:   Learning Objectives: Weigh and measure foods. Estimate the fat and calorie content of common foods. Describe three ways to eat less fat and fewer calories. Create a plan to eat less fat for the following week.   Goals:  Track weight when weighing outside of class.  Track food and beverages eaten each day in Food and Activity Tracker and include fat grams and calories for each.  Try to stay within fat gram goal.  Complete plan for eating less high fat foods and answer related homework questions.    Follow-Up Plan: Attend Core Session 4 next week.  Bring completed "Food and Activity Tracker" next week to be reviewed by Lifestyle Coach.   

## 2022-07-28 DIAGNOSIS — R948 Abnormal results of function studies of other organs and systems: Secondary | ICD-10-CM | POA: Diagnosis not present

## 2022-07-28 DIAGNOSIS — D519 Vitamin B12 deficiency anemia, unspecified: Secondary | ICD-10-CM | POA: Diagnosis not present

## 2022-07-28 DIAGNOSIS — E65 Localized adiposity: Secondary | ICD-10-CM | POA: Diagnosis not present

## 2022-07-28 DIAGNOSIS — Z6841 Body Mass Index (BMI) 40.0 and over, adult: Secondary | ICD-10-CM | POA: Diagnosis not present

## 2022-07-28 DIAGNOSIS — R7303 Prediabetes: Secondary | ICD-10-CM | POA: Diagnosis not present

## 2022-07-31 DIAGNOSIS — R69 Illness, unspecified: Secondary | ICD-10-CM | POA: Diagnosis not present

## 2022-08-01 ENCOUNTER — Encounter: Payer: Self-pay | Admitting: Dietician

## 2022-08-01 ENCOUNTER — Encounter: Payer: No Typology Code available for payment source | Attending: Internal Medicine | Admitting: Dietician

## 2022-08-01 DIAGNOSIS — R7303 Prediabetes: Secondary | ICD-10-CM | POA: Insufficient documentation

## 2022-08-01 NOTE — Progress Notes (Signed)
Patient was seen on 08/01/2022 for the Core Session 4 of Diabetes Prevention Program course at [facility]. By the end of this session patients are able to complete the following objectives:   Learning Objectives: Explain the health benefits of eating less fat and fewer calories. Describe the MyPlate food guide and its recommendations, including how to reduce fat and calories in our diet. Compare and contrast MyPlate guidelines with participants' eating habits. List ways to replace high-fat and high-calorie foods with low-fat and low-calorie foods. Explain the importance of eating plenty of whole grains, vegetables, and fruits, while staying within fat gram goals. Explain the importance of eating foods from all groups of MyPlate and of eating a variety of foods from within each group. Explain why a balanced diet is beneficial to health. Explain why eating the same foods over and over is not the best strategy for long-term success.   Goals:  Record weight taken outside of class.  Track foods and beverages eaten each day in the "Food and Activity Tracker," including calories and fat grams for each item.  Practice comparing what you eat with the recommendations of MyPlate using the "Rate Your Plate" handout.  Complete the "Rate Your Plate" handout form on at least 3 days.  Answer homework questions.   Follow-Up Plan: Attend Core Session 5 next week.  Bring completed "Food and Activity Tracker" next week to be reviewed by Lifestyle Coach.   

## 2022-08-08 ENCOUNTER — Encounter: Payer: Self-pay | Admitting: Dietician

## 2022-08-08 ENCOUNTER — Encounter: Payer: No Typology Code available for payment source | Attending: Internal Medicine | Admitting: Dietician

## 2022-08-08 DIAGNOSIS — R7303 Prediabetes: Secondary | ICD-10-CM | POA: Diagnosis not present

## 2022-08-08 NOTE — Progress Notes (Signed)
Patient was seen on 08/08/2022 for the Core Session 5 of Diabetes Prevention Program course at [facility]. By the end of this session patients are able to complete the following objectives:   Learning Objectives: Establish a physical activity goal. Explain the importance of the physical activity goal. Describe their current level of physical activity. Name ways that they are already physically active. Develop personal plans for physical activity for the next week.   Goals:  Record weight taken outside of class.  Track foods and beverages eaten each day in the "Food and Activity Tracker," including calories and fat grams for each item.  Make an Activity Plan including date, specific type of activity, and length of time you plan to be active that includes at last 60 minutes of activity for the week.  Track activity type, minutes you were active, and distance you reached each day in the "Food and Activity Tracker."   Follow-Up Plan: Attend Core Session 6 next week.  Bring completed "Food and Activity Tracker" next week to be reviewed by Lifestyle Coach.   

## 2022-08-15 ENCOUNTER — Encounter: Payer: No Typology Code available for payment source | Attending: Internal Medicine | Admitting: Dietician

## 2022-08-15 ENCOUNTER — Encounter: Payer: Self-pay | Admitting: Dietician

## 2022-08-15 DIAGNOSIS — R7303 Prediabetes: Secondary | ICD-10-CM | POA: Diagnosis not present

## 2022-08-15 NOTE — Progress Notes (Signed)
Patient was seen on 08/15/2022 for the Core Session 6 of Diabetes Prevention Program course at [facility]. By the end of this session patients are able to complete the following objectives:   Learning Objectives: Graph their daily physical activity.  Describe two ways of finding the time to be active.  Define "lifestyle activity."  Describe how to prevent injury.  Develop an activity plan for the coming week.   Goals:  Record weight taken outside of class.  Track foods and beverages eaten each day in the "Food and Activity Tracker," including calories and fat grams for each item.   Track activity type, minutes you were active, and distance you reached each day in the "Food and Activity Tracker."  Set aside one 20 to 30-minute block of time every day or find two or more periods of 10 to15 minutes each for physical activity.  Warm up, cool down, and stretch. Make a Physical Activities Plan for the Week.   Follow-Up Plan: Attend Core Session 7 next week.  Bring completed "Food and Activity Tracker" next week to be reviewed by Lifestyle Coach.  

## 2022-08-18 ENCOUNTER — Telehealth: Payer: Self-pay | Admitting: Emergency Medicine

## 2022-08-19 ENCOUNTER — Ambulatory Visit (HOSPITAL_BASED_OUTPATIENT_CLINIC_OR_DEPARTMENT_OTHER): Admission: RE | Admit: 2022-08-19 | Payer: No Typology Code available for payment source | Source: Ambulatory Visit

## 2022-08-21 NOTE — Telephone Encounter (Signed)
Patient checking on message for CT scan. Patient phone number is [phone].

## 2022-08-22 ENCOUNTER — Encounter: Payer: Self-pay | Admitting: Dietician

## 2022-08-22 ENCOUNTER — Encounter: Payer: No Typology Code available for payment source | Attending: Internal Medicine | Admitting: Dietician

## 2022-08-22 DIAGNOSIS — R7303 Prediabetes: Secondary | ICD-10-CM | POA: Diagnosis not present

## 2022-08-22 NOTE — Telephone Encounter (Addendum)
I rescheduled pt's CT for her and had to reschedule RB's follow up appt in order to do so.  I made pt aware GI could not get her in prior to RB's appt she already had scheduled.  She stated ok - appts rescheduled.  Nothing further needed.

## 2022-08-22 NOTE — Progress Notes (Signed)
Patient was seen on 08/22/2022 for the Core Session 7 of Diabetes Prevention Program course at [facility]. By the end of this session patients are able to complete the following objectives:   Learning Objectives: Define calorie balance. Explain how healthy eating and being active are related in terms of calorie balance.  Describe the relationship between calorie balance and weight loss.  Describe his or her progress as it relates to calorie balance.  Develop an activity plan for the coming week.   Goals:  Record weight taken outside of class.  Track foods and beverages eaten each day in the "Food and Activity Tracker," including calories and fat grams for each item.   Track activity type, minutes you were active, and distance you reached each day in the "Food and Activity Tracker."  Set aside one 20 to 30-minute block of time every day or find two or more periods of 10 to15 minutes each for physical activity.  Make a Physical Activities Plan for the Week.  Make active lifestyle choices all through the day  Stay at or go slightly over activity goal.   Follow-Up Plan: Attend Core Session 8 next week.  Bring completed "Food and Activity Tracker" next week to be reviewed by Lifestyle Coach.  

## 2022-08-26 DIAGNOSIS — Z1159 Encounter for screening for other viral diseases: Secondary | ICD-10-CM | POA: Diagnosis not present

## 2022-08-26 DIAGNOSIS — M1612 Unilateral primary osteoarthritis, left hip: Secondary | ICD-10-CM | POA: Diagnosis not present

## 2022-08-26 DIAGNOSIS — Z0000 Encounter for general adult medical examination without abnormal findings: Secondary | ICD-10-CM | POA: Diagnosis not present

## 2022-08-26 DIAGNOSIS — Z780 Asymptomatic menopausal state: Secondary | ICD-10-CM | POA: Diagnosis not present

## 2022-08-26 DIAGNOSIS — E559 Vitamin D deficiency, unspecified: Secondary | ICD-10-CM | POA: Diagnosis not present

## 2022-08-26 DIAGNOSIS — R7303 Prediabetes: Secondary | ICD-10-CM | POA: Diagnosis not present

## 2022-08-26 DIAGNOSIS — E785 Hyperlipidemia, unspecified: Secondary | ICD-10-CM | POA: Diagnosis not present

## 2022-08-26 DIAGNOSIS — D869 Sarcoidosis, unspecified: Secondary | ICD-10-CM | POA: Diagnosis not present

## 2022-08-26 DIAGNOSIS — Z1211 Encounter for screening for malignant neoplasm of colon: Secondary | ICD-10-CM | POA: Diagnosis not present

## 2022-08-26 DIAGNOSIS — D7589 Other specified diseases of blood and blood-forming organs: Secondary | ICD-10-CM | POA: Diagnosis not present

## 2022-08-26 DIAGNOSIS — F321 Major depressive disorder, single episode, moderate: Secondary | ICD-10-CM | POA: Diagnosis not present

## 2022-08-26 DIAGNOSIS — G4709 Other insomnia: Secondary | ICD-10-CM | POA: Diagnosis not present

## 2022-08-29 ENCOUNTER — Encounter: Payer: Self-pay | Admitting: Dietician

## 2022-08-29 ENCOUNTER — Encounter: Payer: No Typology Code available for payment source | Attending: Internal Medicine | Admitting: Dietician

## 2022-08-29 DIAGNOSIS — R7303 Prediabetes: Secondary | ICD-10-CM | POA: Diagnosis not present

## 2022-08-29 NOTE — Progress Notes (Signed)
Patient was seen on 08/29/2022 for the Core Session 8 of Diabetes Prevention Program course at [facility]. By the end of this session patients are able to complete the following objectives:   Learning Objectives: Recognize positive and negative food and activity cues.  Change negative food and activity cues to positive cues.  Add positive cues for activity and eliminate cues for inactivity.  Develop a plan for removing one problem food cue for the coming week.   Goals:  Record weight taken outside of class.  Track foods and beverages eaten each day in the "Food and Activity Tracker," including calories and fat grams for each item.   Track activity type, minutes you were active, and distance you reached each day in the "Food and Activity Tracker."  Set aside one 20 to 30-minute block of time every day or find two or more periods of 10 to15 minutes each for physical activity.  Remove one problem food cue.  Add one positive cue for being more active.  Follow-Up Plan: Attend Core Session 9 next week.  Bring completed "Food and Activity Tracker" next week to be reviewed by Lifestyle Coach.  

## 2022-09-03 ENCOUNTER — Ambulatory Visit: Payer: No Typology Code available for payment source | Admitting: Emergency Medicine

## 2022-09-05 ENCOUNTER — Encounter: Payer: Self-pay | Admitting: Dietician

## 2022-09-05 ENCOUNTER — Encounter: Payer: No Typology Code available for payment source | Attending: Internal Medicine | Admitting: Dietician

## 2022-09-05 DIAGNOSIS — R7303 Prediabetes: Secondary | ICD-10-CM | POA: Insufficient documentation

## 2022-09-05 NOTE — Progress Notes (Signed)
On 09/05/22 patient completed a post core session of the Diabetes Prevention Program course with [facility]. By the end of this session patients are able to complete the following objectives:     Learning Objectives:                       Identify which foods contain carbohydrates.                       List functions for carbohydrates on the body.                       Describe the relationship between carbohydrate intake and blood sugar.                       Create balanced snack choices.     Goals:  Record weight taken outside of class. Track foods and beverages eaten each day in the "Food and Activity Tracker," including calories and fat grams for each item.  Track activity type, minutes you were active, and distance you reached each day in the "Food and Activity Tracker."    Follow-Up Plan:  Attend next session. Email completed "Food and Activity Trackers" before next session to be reviewed by Lifestyle Coach 

## 2022-09-12 ENCOUNTER — Encounter: Payer: No Typology Code available for payment source | Attending: Internal Medicine | Admitting: Dietician

## 2022-09-12 ENCOUNTER — Encounter: Payer: Self-pay | Admitting: Dietician

## 2022-09-12 DIAGNOSIS — M85852 Other specified disorders of bone density and structure, left thigh: Secondary | ICD-10-CM | POA: Diagnosis not present

## 2022-09-12 DIAGNOSIS — R7303 Prediabetes: Secondary | ICD-10-CM | POA: Diagnosis not present

## 2022-09-12 DIAGNOSIS — Z1231 Encounter for screening mammogram for malignant neoplasm of breast: Secondary | ICD-10-CM | POA: Diagnosis not present

## 2022-09-12 NOTE — Progress Notes (Signed)
Patient was seen on 09/12/2022 for the Core Session 9 of Diabetes Prevention Program course at [facility]. By the end of this session patients are able to complete the following objectives:   Learning Objectives: List and describe five steps to problem solving.  Apply the five problem solving steps to resolve a problem he or she has with eating less fat and fewer calories or being more active.   Goals:  Record weight taken outside of class.  Track foods and beverages eaten each day in the "Food and Activity Tracker," including calories and fat grams for each item.   Track activity type, minutes you were active, and distance you reached each day in the "Food and Activity Tracker."  Set aside one 20 to 30-minute block of time every day or find two or more periods of 10 to15 minutes each for physical activity.  Use problem solving action plan created during session to problem solve.   Follow-Up Plan: Attend Core Session 10 next week.  Bring completed "Food and Activity Tracker" next week to be reviewed by Lifestyle Coach. Bring menus from favorite restaurants to next session for future discussion.   

## 2022-09-16 DIAGNOSIS — M25552 Pain in left hip: Secondary | ICD-10-CM | POA: Diagnosis not present

## 2022-09-16 DIAGNOSIS — M545 Low back pain, unspecified: Secondary | ICD-10-CM | POA: Diagnosis not present

## 2022-09-16 DIAGNOSIS — M25551 Pain in right hip: Secondary | ICD-10-CM | POA: Diagnosis not present

## 2022-09-19 ENCOUNTER — Encounter: Payer: No Typology Code available for payment source | Attending: Internal Medicine | Admitting: Dietician

## 2022-09-19 ENCOUNTER — Other Ambulatory Visit: Payer: Self-pay | Admitting: *Deleted

## 2022-09-19 ENCOUNTER — Encounter: Payer: Self-pay | Admitting: Dietician

## 2022-09-19 DIAGNOSIS — R7303 Prediabetes: Secondary | ICD-10-CM | POA: Diagnosis not present

## 2022-09-19 DIAGNOSIS — D759 Disease of blood and blood-forming organs, unspecified: Secondary | ICD-10-CM

## 2022-09-19 NOTE — Progress Notes (Signed)
Patient was seen on 09/19/2022 for the Core Session 10 of Diabetes Prevention Program course at [facility]. By the end of this session patients are able to complete the following objectives:   Learning Objectives: List and describe the four keys for healthy eating out.  Give examples of how to apply these keys at the type of restaurants that the participants go to regularly.  Make an appropriate meal selection from a restaurant menu.  Demonstrate how to ask for a substitute item using assertive language and a polite tone of voice.    Goals:  Record weight taken outside of class.  Track foods and beverages eaten each day in the "Food and Activity Tracker," including calories and fat grams for each item.   Track activity type, minutes you were active, and distance you reached each day in the "Food and Activity Tracker."  Set aside one 20 to 30-minute block of time every day or find two or more periods of 10 to15 minutes each for physical activity.  Utilize positive action plan and complete questions on "To Do List."   Follow-Up Plan: Attend Core Session 11 next week.  Bring completed "Food and Activity Tracker" next week to be reviewed by Lifestyle Coach.  

## 2022-09-22 ENCOUNTER — Inpatient Hospital Stay (HOSPITAL_BASED_OUTPATIENT_CLINIC_OR_DEPARTMENT_OTHER): Payer: No Typology Code available for payment source | Admitting: Oncology

## 2022-09-22 ENCOUNTER — Inpatient Hospital Stay: Payer: No Typology Code available for payment source | Attending: Oncology

## 2022-09-22 VITALS — BP 124/63 | HR 73 | Temp 98.2°F | Resp 18 | Ht 65.0 in | Wt 244.2 lb

## 2022-09-22 DIAGNOSIS — D759 Disease of blood and blood-forming organs, unspecified: Secondary | ICD-10-CM

## 2022-09-22 DIAGNOSIS — D72819 Decreased white blood cell count, unspecified: Secondary | ICD-10-CM | POA: Insufficient documentation

## 2022-09-22 DIAGNOSIS — D696 Thrombocytopenia, unspecified: Secondary | ICD-10-CM | POA: Insufficient documentation

## 2022-09-22 LAB — CBC WITH DIFFERENTIAL (CANCER CENTER ONLY)
Abs Immature Granulocytes: 0.01 10*3/uL (ref 0.00–0.07)
Basophils Absolute: 0 10*3/uL (ref 0.0–0.1)
Basophils Relative: 1 %
Eosinophils Absolute: 0.2 10*3/uL (ref 0.0–0.5)
Eosinophils Relative: 4 %
HCT: 37.9 % (ref 36.0–46.0)
Hemoglobin: 12.4 g/dL (ref 12.0–15.0)
Immature Granulocytes: 0 %
Lymphocytes Relative: 35 %
Lymphs Abs: 1.3 10*3/uL (ref 0.7–4.0)
MCH: 29.5 pg (ref 26.0–34.0)
MCHC: 32.7 g/dL (ref 30.0–36.0)
MCV: 90.2 fL (ref 80.0–100.0)
Monocytes Absolute: 0.3 10*3/uL (ref 0.1–1.0)
Monocytes Relative: 9 %
Neutro Abs: 2 10*3/uL (ref 1.7–7.7)
Neutrophils Relative %: 51 %
Platelet Count: 143 10*3/uL — ABNORMAL LOW (ref 150–400)
RBC: 4.2 MIL/uL (ref 3.87–5.11)
RDW: 12.3 % (ref 11.5–15.5)
WBC Count: 3.8 10*3/uL — ABNORMAL LOW (ref 4.0–10.5)
nRBC: 0 % (ref 0.0–0.2)

## 2022-09-22 LAB — SAVE SMEAR(SSMR), FOR PROVIDER SLIDE REVIEW

## 2022-09-22 NOTE — Progress Notes (Signed)
[facility] New Patient Consult   Requesting MD: Thana Ates, Md [address] [place],  [place] [address]   Diana Calhoun 66 y.o.  1956-01-02    Reason for Consult: Leukopenia   HPI: Diana Calhoun is referred for evaluation of leukopenia..  When she was seen at [facility] medicine on 06/05/2022 the white count returned at 3.7, hemoglobin 11.8, MCV 88.9, and platelets 167,000.  The ANC was 1.9 and absolute lymphocyte count 1.2.  On 02/27/2021 the hemoglobin returned at 12, platelets 152,000, and WBC 3.6 The platelet count was measured at 145,000 on 02/04/2022 and 05/22/2022.  On 05/22/2022 the creatinine returned at 0.81, albumin 3.7, bilirubin 0.4, AST 20, ALT 15.  The vitamin B12 returned at 385 on 06/05/2022.  She reports seeing a hematologist approximately 20 years ago while living in [place] for evaluation of leukopenia.  Diana Calhoun is followed Dr. Delton Coombes for sarcoidosis.  She has been on prednisone in the past, but not for the past 6 months.   Past medical history: Asthma Sarcoidosis Osteopenia "Arthritis " Graves' disease treated with radioactive iodine Mitral regurgitation G1, P1 Bilateral cataracts Prediabetes Gastroesophageal reflux disease Right retinal tear 13 years ago  Past Surgical History:  Procedure Laterality Date   CESAREAN SECTION     PERCARDIUM effusion  .       TEE WITHOUT CARDIOVERSION N/A 06/11/2020   Procedure: TRANSESOPHAGEAL ECHOCARDIOGRAM (TEE);  Surgeon: Wendall Stade, MD;  Location: [facility] END[facility]COPY;  Service: Cardiovascular;  Laterality: N/A;    .  Tonsillectomy while in college  Medications: Reviewed  Allergies:  Allergies  Allergen Reactions   Shellfish Allergy Itching   Sulfa Antibiotics Other (See Comments)    Unknown    Family history: Her brother had pancreas cancer and was an alcoholic  Social History:   She lives with her daughter and grandchildren in [place].  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  She does not use cigarettes.  Rare alcohol use.  She received a transfusion with the C-section 37 years ago.  No risk factor for HIV or hepatitis.  ROS:   Positives include: Hot flashes, frequent urination, constipation, decrease in night vision, discomfort in the right shoulder for the past month  A complete ROS was otherwise negative.  Physical Exam:  Blood pressure 124/63, pulse 73, temperature 98.2 F (36.8 C), temperature source Oral, resp. rate 18, height 5\' 5"  (1.651 m), weight 244 lb 3.2 oz (110.8 kg), SpO2 98 %.  HEENT: Oral cavity without visible mass, neck without mass Lungs: Clear bilaterally Cardiac: Regular rate and rhythm Abdomen: No hepatosplenomegaly Musculoskeletal: Mild pain with abduction of the right shoulder Vascular: No leg edema Lymph nodes: No cervical, supraclavicular, axillary, or inguinal nodes Neurologic: Alert and oriented, the motor exam appears intact in the upper and lower extremities bilaterally Skin: Hypopigmented areas at the upper back and lower legs Musculoskeletal: Tender at the lower back  LAB:  CBC  Lab Results  Component Value Date   WBC 3.8 (L) 09/22/2022   HGB 12.4 09/22/2022   HCT 37.9 09/22/2022   MCV 90.2 09/22/2022   PLT 143 (L) 09/22/2022   NEUTROABS 2.0 09/22/2022    Blood smear: Few ovalocytes, rare teardrop, the polychromasia is not increased.  Majority the white cells are mature neutrophils and lymphocytes.  No blasts or other young forms are seen.  No monotonous population.  The platelets appear normal in number, few large platelets.  No  platelet clumps    CMP  Lab Results  Component Value Date   NA 143 11/21/2020   K 4.3 11/21/2020   CL 106 11/21/2020   CO2 23 11/21/2020   GLUCOSE 109 (H) 11/21/2020   BUN 18 11/21/2020   CREATININE 1.00 11/21/2020   CALCIUM 9.6 11/21/2020   PROT 6.5 10/20/2020   ALBUMIN 2.9 (L) 10/20/2020   AST 36 10/20/2020   ALT 33 10/20/2020   ALKPHOS 52  10/20/2020   BILITOT 0.8 10/20/2020   GFRNONAA 59 (L) 11/21/2020   GFRAA 68 11/21/2020        Assessment/Plan:   Leukopenia-mild, chronic  Mild thrombocytopenia 09/22/2022 Platelets 145,000 at [facility] medicine 02/04/2022 and 05/22/2022 3.  Sarcoidosis 4.  Asthma 5.  Mitral regurgitation 6.  Osteopenia 7.  Pericardial window for treatment of sarcoidosis 25 years ago 62.  Gastroesophageal reflux disease 9.  Graves' disease, status post radioactive iodine therapy 04/24/2021 10.  Family history of pancreas cancer-brother with history of alcoholism   Disposition:   Diana Calhoun is referred for valuation of leukopenia.  The leukopenia appears to be longstanding based upon review of the available medical records.  She reports a history of leukopenia evaluated by a hematologist approximate 20 years ago while living in [place].  I suspect the leukopenia is benign normal variant.  It is possible she has mild leukopenia related to autoimmune disease the history of Graves' disease and sarcoidosis.  I have a low clinical suspicion for a primary hematologic condition or underlying chronic liver disease.  We obtained a myeloma panel today.  She will return for an office visit and CBC in 9 months.  Betsy Coder, MD  09/22/2022, 10:58 AM

## 2022-09-23 ENCOUNTER — Other Ambulatory Visit: Payer: Self-pay | Admitting: *Deleted

## 2022-09-23 DIAGNOSIS — D759 Disease of blood and blood-forming organs, unspecified: Secondary | ICD-10-CM

## 2022-09-25 ENCOUNTER — Ambulatory Visit
Admission: RE | Admit: 2022-09-25 | Discharge: 2022-09-25 | Disposition: A | Discharge status: Home / Self Care | Payer: No Typology Code available for payment source | Source: Ambulatory Visit | Attending: Emergency Medicine | Admitting: Emergency Medicine

## 2022-09-25 DIAGNOSIS — D869 Sarcoidosis, unspecified: Secondary | ICD-10-CM

## 2022-09-25 DIAGNOSIS — R911 Solitary pulmonary nodule: Secondary | ICD-10-CM | POA: Diagnosis not present

## 2022-09-25 DIAGNOSIS — I700 Atherosclerosis of aorta: Secondary | ICD-10-CM | POA: Diagnosis not present

## 2022-09-26 ENCOUNTER — Encounter: Payer: Self-pay | Admitting: Dietician

## 2022-09-26 ENCOUNTER — Encounter: Payer: No Typology Code available for payment source | Attending: Internal Medicine | Admitting: Dietician

## 2022-09-26 DIAGNOSIS — R7303 Prediabetes: Secondary | ICD-10-CM | POA: Insufficient documentation

## 2022-09-26 NOTE — Progress Notes (Signed)
On 09/26/2022 completed Session 11 of Diabetes Prevention Program course  with [facility]. By the end of this session patients are able to complete the following objectives:   Learning Objectives: Give examples of negative thoughts that could prevent them from meeting their goals of losing weight and being more physically active.  Describe how to stop negative thoughts and talk back to them with positive thoughts.  Practice 1) stopping negative thoughts and 2) talking back to negative thoughts with positive ones.    Goals:  Record weight taken outside of class.  Track foods and beverages eaten each day in the "Food and Activity Tracker," including calories and fat grams for each item.   Track activity type, minutes you were active, and distance you reached each day in the "Food and Activity Tracker."  If you have any negative thoughts-write them in your Food and Activity Trackers, along with how you talked back to them. Practice stopping negative thoughts and talking back to them with positive thoughts.   Follow-Up Plan: Attend Core Session 12 next week.  Bring completed "Food and Activity Tracker" next week to be reviewed by Lifestyle Coach.  

## 2022-09-29 LAB — MULTIPLE MYELOMA PANEL, SERUM
Albumin SerPl Elph-Mcnc: 3.6 g/dL (ref 2.9–4.4)
Albumin/Glob SerPl: 1.1 (ref 0.7–1.7)
Alpha 1: 0.2 g/dL (ref 0.0–0.4)
Alpha2 Glob SerPl Elph-Mcnc: 0.5 g/dL (ref 0.4–1.0)
B-Globulin SerPl Elph-Mcnc: 1.1 g/dL (ref 0.7–1.3)
Gamma Glob SerPl Elph-Mcnc: 1.5 g/dL (ref 0.4–1.8)
Globulin, Total: 3.4 g/dL (ref 2.2–3.9)
IgA: 412 mg/dL — ABNORMAL HIGH (ref 87–352)
IgG (Immunoglobin G), Serum: 1618 mg/dL — ABNORMAL HIGH (ref 586–1602)
IgM (Immunoglobulin M), Srm: 83 mg/dL (ref 26–217)
Total Protein ELP: 7 g/dL (ref 6.0–8.5)

## 2022-10-01 ENCOUNTER — Ambulatory Visit: Payer: No Typology Code available for payment source | Admitting: Emergency Medicine

## 2022-10-01 ENCOUNTER — Encounter: Payer: Self-pay | Admitting: Emergency Medicine

## 2022-10-01 VITALS — BP 108/68 | HR 86 | Temp 97.7°F | Ht 65.0 in | Wt 246.4 lb

## 2022-10-01 DIAGNOSIS — J301 Allergic rhinitis due to pollen: Secondary | ICD-10-CM

## 2022-10-01 DIAGNOSIS — J4489 Other specified chronic obstructive pulmonary disease: Secondary | ICD-10-CM

## 2022-10-01 DIAGNOSIS — N281 Cyst of kidney, acquired: Secondary | ICD-10-CM | POA: Insufficient documentation

## 2022-10-01 DIAGNOSIS — D869 Sarcoidosis, unspecified: Secondary | ICD-10-CM

## 2022-10-01 DIAGNOSIS — R053 Chronic cough: Secondary | ICD-10-CM | POA: Diagnosis not present

## 2022-10-01 MED ORDER — FLUTICASONE PROPIONATE 50 MCG/ACT NA SUSP: 2.0000 | Freq: Two times a day (BID) | NASAL | 11 refills | Status: AC

## 2022-10-01 NOTE — Assessment & Plan Note (Signed)
Suspected right renal cyst noted on her CT scan of the chest.  A renal ultrasound was recommended to further characterize.  We will order this for her today.

## 2022-10-01 NOTE — Assessment & Plan Note (Signed)
Treating chronic rhinitis and GERD

## 2022-10-01 NOTE — Progress Notes (Signed)
   Subjective:    Patient ID: Diana Calhoun, female    DOB: 01/19/1956, 66 y.o.   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  PMH also significant for hyperthyroidism. She underwent surveillance CT scan of the chest on 09/25/2022 as below.  Her last pulmonary function testing was done and January 2022 She has been dealing with Currently managed on Breo, Singulair, fluticasone nasal spray.  We will try starting loratadine last time but she notes persistent nasal congestion and drainage on this. She also tried Careers adviser. Wants to try zyrtec. Using albuterol qd. No rash. She has had some thrombocytopenia.    CT chest 09/25/2022 reviewed by me, shows stable mediastinal and hilar lymphadenopathy without significant change compared with 08/2020.  Resolution of some left basilar groundglass inflammatory change.  Note was made of an incompletely visualized 5.2 cm low-density right renal lesion, likely a cyst.  Renal ultrasound was recommended.   Review of Systems As per HPI      Objective:   Physical Exam  Vitals:   10/01/22 0831  BP: 108/68  Pulse: 86  Temp: 97.7 F (36.5 C)  TempSrc: Oral  SpO2: 96%  Weight: 246 lb 6.4 oz (111.8 kg)  Height: 5\' 5"  (1.651 m)   Gen: Pleasant, overwt woman, in no distress,  normal affect  ENT: No lesions,  mouth clear,  oropharynx clear, no postnasal drip  Neck: No JVD, no stridor  Lungs: No use of accessory muscles, no crackles or wheezing on normal respiration, no wheeze on forced expiration  Cardiovascular: RRR, heart sounds normal, no murmur or gallops, no peripheral edema  Musculoskeletal: No deformities, no cyanosis or clubbing  Neuro: alert, awake, non focal  Skin: Warm, no rash     Assessment & Plan:  Sarcoidosis We reviewed your CT scan of the chest  today.  This is stable.  Good news.  Asthma with COPD (HCC) Continue your Breo 1 inhalation once daily.  Rinse and gargle after using. Keep albuterol available to use 2 puffs when you needed for shortness of breath, chest tightness, wheezing. We will plan to repeat your pulmonary function testing at some point going forward.  We do not need to schedule this right now. COVID-19 vaccine is up-to-date Consider getting the flu shot this fall Follow with Dr in 6 months or sooner if you have any problems  Allergic rhinitis Increase your fluticasone nasal spray to 2 sprays each nostril twice a day.  Try not to take this right before bedtime or it would drain to your throat and cause irritation. Stop Claritin and start Zyrtec once daily. Continue Singulair Try using chlorpheniramine (chlor tabs) 4 mg up to every 6 hours if needed for allergy symptoms.  Please note that this medication can sometimes cause drowsiness  Cough Treating chronic rhinitis and GERD  Renal cyst Suspected right renal cyst noted on her CT scan of the chest.  A renal ultrasound was recommended to further characterize.  We will order this for her today.  Delton Coombes, MD, PhD 10/01/2022, 9:34 AM [facility] [phone] or if no answer [phone]

## 2022-10-01 NOTE — Assessment & Plan Note (Signed)
Continue your Breo 1 inhalation once daily.  Rinse and gargle after using. Keep albuterol available to use 2 puffs when you needed for shortness of breath, chest tightness, wheezing. We will plan to repeat your pulmonary function testing at some point going forward.  We do not need to schedule this right now. COVID-19 vaccine is up-to-date Consider getting the flu shot this fall Follow with Dr Delton Coombes in 6 months or sooner if you have any problems

## 2022-10-01 NOTE — Assessment & Plan Note (Signed)
We reviewed your CT scan of the chest today.  This is stable.  Good news.

## 2022-10-01 NOTE — Patient Instructions (Signed)
We reviewed your CT scan of the chest today.  This is stable.  Good news. We will arrange for a right renal ultrasound to evaluate a right renal cyst.  Please follow-up the results of this with Dr. Margaretann Loveless Continue your Breo 1 inhalation once daily.  Rinse and gargle after using. Keep albuterol available to use 2 puffs when you needed for shortness of breath, chest tightness, wheezing. We will plan to repeat your pulmonary function testing at some point going forward.  We do not need to schedule this right now. Increase your fluticasone nasal spray to 2 sprays each nostril twice a day.  Try not to take this right before bedtime or it would drain to your throat and cause irritation. Stop Claritin and start Zyrtec once daily. Continue Singulair Try using chlorpheniramine (chlor tabs) 4 mg up to every 6 hours if needed for allergy symptoms.  Please note that this medication can sometimes cause drowsiness COVID-19 vaccine is up-to-date Consider getting the flu shot this fall Follow with Dr Delton Coombes in 6 months or sooner if you have any problems

## 2022-10-01 NOTE — Assessment & Plan Note (Signed)
Increase your fluticasone nasal spray to 2 sprays each nostril twice a day.  Try not to take this right before bedtime or it would drain to your throat and cause irritation. Stop Claritin and start Zyrtec once daily. Continue Singulair Try using chlorpheniramine (chlor tabs) 4 mg up to every 6 hours if needed for allergy symptoms.  Please note that this medication can sometimes cause drowsiness

## 2022-10-03 ENCOUNTER — Other Ambulatory Visit (HOSPITAL_COMMUNITY): Payer: Self-pay

## 2022-10-03 ENCOUNTER — Encounter: Payer: No Typology Code available for payment source | Attending: Internal Medicine | Admitting: Dietician

## 2022-10-03 ENCOUNTER — Encounter: Payer: Self-pay | Admitting: Dietician

## 2022-10-03 DIAGNOSIS — R7303 Prediabetes: Secondary | ICD-10-CM

## 2022-10-03 NOTE — Progress Notes (Signed)
Patient was seen on 10/03/2022 for the Core Session 12 of Diabetes Prevention Program course at [facility]. By the end of this session patients are able to complete the following objectives:   Learning Objectives: Describe their current progress toward defined goals. Describe common causes for slipping from healthy eating or being active. Explain what to do to get back on their feet after a slip.  Goals:  Record weight taken outside of class.  Track foods and beverages eaten each day in the "Food and Activity Tracker," including calories and fat grams for each item.   Track activity type, minutes active, and distance reached each day in the "Food and Activity Tracker."  Try out the two action plans created during session- "Slips from Healthy Eating: Action Plan" and "Slips from Being Active: Action Plan" Answer questions on the handout.   Follow-Up Plan: Attend Core Session 13 next week.  Bring completed "Food and Activity Tracker" next week to be reviewed by Lifestyle Coach.  

## 2022-10-07 ENCOUNTER — Other Ambulatory Visit: Payer: No Typology Code available for payment source

## 2022-10-16 ENCOUNTER — Other Ambulatory Visit (HOSPITAL_COMMUNITY): Payer: Self-pay

## 2022-10-17 ENCOUNTER — Encounter: Payer: Self-pay | Admitting: Registered"

## 2022-10-17 ENCOUNTER — Encounter: Payer: No Typology Code available for payment source | Attending: Internal Medicine | Admitting: Registered"

## 2022-10-17 DIAGNOSIS — R7303 Prediabetes: Secondary | ICD-10-CM

## 2022-10-17 NOTE — Progress Notes (Signed)
On 10/17/22 patient completed the Core Session 13 of Diabetes Prevention Program course virtually with [facility]. By the end of this session patients are able to complete the following objectives:   Virtual Visit via Video Note  I connected with Diana Calhoun on 10/17/22 at  3:30 PM EST by a video enabled application and verified that I am speaking with the correct person using two identifiers.  Location: Patient: Home.  Provider: Office.   Learning Objectives: Describe ways to add interest and variety to their activity plans. Define ?aerobic fitness. Explain the four F.I.T.T. principles (frequency, intensity, time, and type of activity) and how they relate to aerobic fitness.   Goals:  Record weight taken outside of class.  Track foods and beverages eaten each day in the "Food and Activity Tracker," including calories and fat grams for each item.   Track activity type, minutes you were active, and distance you reached each day in the "Food and Activity Tracker."  Do your best to reach activity goal for the week. Use one of the F.I.T.T. principles to jump start workouts. Document activity level on the "To Do Next Week" handout.  Follow-Up Plan: Attend Core Session 14 next week.  Email completed "Food and Activity Tracker" before next week to be reviewed by Lifestyle Coach.

## 2022-10-20 ENCOUNTER — Ambulatory Visit
Admission: RE | Admit: 2022-10-20 | Discharge: 2022-10-20 | Disposition: A | Discharge status: Home / Self Care | Payer: No Typology Code available for payment source | Source: Ambulatory Visit | Attending: Emergency Medicine | Admitting: Emergency Medicine

## 2022-10-20 DIAGNOSIS — N281 Cyst of kidney, acquired: Secondary | ICD-10-CM

## 2022-10-22 ENCOUNTER — Encounter: Payer: No Typology Code available for payment source | Attending: Internal Medicine | Admitting: Dietician

## 2022-10-22 ENCOUNTER — Encounter: Payer: Self-pay | Admitting: Dietician

## 2022-10-22 DIAGNOSIS — R7303 Prediabetes: Secondary | ICD-10-CM | POA: Diagnosis not present

## 2022-10-22 NOTE — Progress Notes (Signed)
Patient was seen on 10/22/2022 for the Core Session 14 of Diabetes Prevention Program course at [facility]. By the end of this session patients are able to complete the following objectives:   Learning Objectives: Give examples of problem social cues and helpful social cues.  Explain how to remove problem social cues and add helpful ones.  Describe ways of coping with vacations and social events such as parties, holidays, and visits from relatives and friends.  Create an action plan to change a problem social cue and add a helpful one.   Goals:  Record weight taken outside of class.  Track foods and beverages eaten each day in the "Food and Activity Tracker," including calories and fat grams for each item.   Track activity type, minutes you were active, and distance you reached each day in the "Food and Activity Tracker."  Do your best to reach activity goal for the week. Use action plan created during session to change a problem social cue and add a helpful social cue.  Answer questions regarding success of changing social cues on "To Do Next Week" handout.   Follow-Up Plan: Attend Core Session 15 next week.  Bring completed "Food and Activity Tracker" next week to be reviewed by Lifestyle Coach.  

## 2022-10-31 ENCOUNTER — Encounter: Payer: No Typology Code available for payment source | Attending: Internal Medicine | Admitting: Dietician

## 2022-10-31 ENCOUNTER — Encounter: Payer: Self-pay | Admitting: Dietician

## 2022-10-31 DIAGNOSIS — R7303 Prediabetes: Secondary | ICD-10-CM | POA: Diagnosis not present

## 2022-10-31 NOTE — Progress Notes (Signed)
Patient was seen on 10/31/2022 for the Core Session 15 of Diabetes Prevention Program course at [facility]. By the end of this session patients are able to complete the following objectives:   Learning Objectives: Explain how to prevent stress or cope with unavoidable stress.  Describe how this program can be a source of stress.  Explain how to manage stressful situations.  Create and follow an action plan for either preventing or coping with a stressful situation.   Goals:  Record weight taken outside of class.  Track foods and beverages eaten each day in the "Food and Activity Tracker," including calories and fat grams for each item.   Track activity type, minutes you were active, and distance you reached each day in the "Food and Activity Tracker."  Do your best to reach activity goal for the week. Follow your action plan to reduce stress.  Answer questions on handout regarding success of action plan.   Follow-Up Plan: Attend Core Session 16 next week.  Bring completed "Food and Activity Tracker" next week to be reviewed by Lifestyle Coach.  

## 2022-11-05 NOTE — Progress Notes (Signed)
Called patient.  Gave all information.  Pt states no further questions.

## 2022-11-05 NOTE — Progress Notes (Unsigned)
Cardiology Office Note:    Date:  11/05/2022   ID:  Diana Calhoun, DOB 1956-04-26, MRN [id number]  PCP:  Thana Ates, MD   [facility] Providers Cardiologist:  Tobias Alexander, MD {   Referring MD: Renford Dills, MD    History of Present Illness:    Diana Calhoun is a 66 y.o. 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 Delton See who now returns to clinic for follow-up.  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 She also underwent calcium scoring which showed calcium score of 0. She had a prior echo  in 2019 that showed LVEF 55%, mild-moderate MR, mild TR, mildly elevated RVSP. 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 TEE was pursued with results below showing no prolapse but restricted posterior leaflet motion with moderate-severe MR and moderate TR. Repeat echo 10/17/20 was felt to be stable with EF 60-65%, grade 1 DD, mild-moderate LAE, posterior MV leaflet motion restricted with moderate MR without significant change from prior except no longer with elevated L or R atrial pressures.  Saw Dr. Delton See in 01/2021 where she was having pleuritic chest pain with concern for pericarditis. She could not take NSAIDs due to GERD. She was started on colchicine.   Seen in clinic on 11/2021 where she was doing well. Chest pain had resolved and she was off colchicine. Was having some mild DOE. TTE 11/2021 showed LVEF 60-65%, normal RV, normal PASP, moderate MR, RAP 3.  Was last seen on 04/2022 where she was working on weight loss. Was otherwise doing well from a CV standpoint.  Today, ***  Past Medical History:  Diagnosis Date   Body mass index (BMI) of 30.0-30.9 in adult    Familial hypercholesterolemia    GERD (gastroesophageal reflux disease)    Hyperthyroidism     Mitral valve regurgitation    MVP (mitral valve prolapse)    Pulmonary sarcoidosis (HCC)    Tricuspid regurgitation    Past Surgical History:  Procedure Laterality Date   CESAREAN SECTION     PERCARDIUM INFUSION     TEE WITHOUT CARDIOVERSION N/A 06/11/2020   Procedure: TRANSESOPHAGEAL ECHOCARDIOGRAM (TEE);  Surgeon: Wendall Stade, MD;  Location: [facility] ENDOSCOPY;  Service: Cardiovascular;  Laterality: N/A;   Current Medications: No outpatient medications have been marked as taking for the 11/13/22 encounter (Appointment) with Meriam Sprague, MD.    Allergies:   Shellfish allergy and Sulfa antibiotics   Social History   Socioeconomic History   Marital status: Single    Spouse name: Not on file   Number of children: Not on file   Years of education: Not on file   Highest education level: Not on file  Occupational History   Not on file  Tobacco Use   Smoking status: Never   Smokeless tobacco: Never  Substance and Sexual Activity   Alcohol use: Not on file   Drug use: Not on file   Sexual activity: Not on file  Other Topics Concern   Not on file  Social History Narrative   Not on file   Social Determinants of Health   Financial Resource Strain: Not on file  Food Insecurity: Not on file  Transportation Needs: Not on file  Physical Activity: Not on file  Stress: Not on file  Social Connections: Not  on file     Family History: The patient's family history includes Diabetes in her sister and sister; Hypertension in her mother.  ROS:   Review of Systems  Constitutional:  Negative for chills and diaphoresis.  HENT:  Positive for congestion. Negative for hearing loss and nosebleeds.   Eyes:  Negative for double vision and pain.  Respiratory:  Positive for shortness of breath. Negative for hemoptysis and sputum production.   Cardiovascular:  Negative for chest pain, palpitations, orthopnea, claudication, leg swelling and PND.  Gastrointestinal:  Negative for  diarrhea, nausea and vomiting.  Genitourinary:  Negative for dysuria and frequency.  Musculoskeletal:  Negative for falls and neck pain.  Skin:  Negative for itching.  Neurological:  Negative for dizziness, seizures and headaches.  Endo/Heme/Allergies:  Negative for polydipsia.  Psychiatric/Behavioral:  Negative for suicidal ideas. The patient has insomnia.    EKGs/Labs/Other Studies Reviewed:    The following studies were reviewed today: TTE 11/2021: IMPRESSIONS    1. Left ventricular ejection fraction, by estimation, is 60 to 65%. The  left ventricle has normal function. The left ventricle has no regional  wall motion abnormalities. Left ventricular diastolic parameters were  normal.   2. Right ventricular systolic function is normal. The right ventricular  size is normal. There is normal pulmonary artery systolic pressure.   3. Left atrial size was moderately dilated.   4. PISA 0.5 cm      ERO 0.14 cm2      MR Vol 24 ml. The mitral valve is rheumatic. Moderate mitral valve  regurgitation. No evidence of mitral stenosis. There is mild holosystolic  prolapse of the middle segment of the anterior leaflet of the mitral  valve.   5. The aortic valve is normal in structure. Aortic valve regurgitation is  not visualized. No aortic stenosis is present.   6. The inferior vena cava is normal in size with greater than 50%  respiratory variability, suggesting right atrial pressure of 3 mmHg.   Echo 10/17/20: 1. Left ventricular ejection fraction, by estimation, is 60 to 65%. The  left ventricle has normal function. The left ventricle has no regional  wall motion abnormalities. Left ventricular diastolic parameters are  consistent with Grade I diastolic  dysfunction (impaired relaxation).   2. Right ventricular systolic function is normal. The right ventricular  size is normal. Tricuspid regurgitation signal is inadequate for assessing  PA pressure.   3. Left atrial size was mild to  moderately dilated.   4. Mitral valve changes suggest rheumatic or other postinflammatory  change. The posterior leaflet motion is restricted. The mitral  regurgitation jet is very eccentric and its severity may be  underestimated. Moderate mitral valve regurgitation. No  evidence of mitral stenosis.   5. The aortic valve is normal in structure. Aortic valve regurgitation is  not visualized. No aortic stenosis is present.   6. The inferior vena cava is normal in size with greater than 50%  respiratory variability, suggesting right atrial pressure of 3 mmHg.   Comparison(s): Prior images reviewed side by side. There is no significant  change in left ventricular systolic function or mitral valve findings, but  there is no longer evidence of elevated left or right atrial pressures.   CT Chest 09/12/2020:  FINDINGS: Cardiovascular: Heart size is normal. No pericardial effusion. Aortic caliber is normal. Central pulmonary vasculature on venous phase assessment is unremarkable.   Mediastinum/Nodes: Bulky adenopathy, bi hilar, subcarinal and RIGHT paratracheal.   RIGHT paratracheal lymph node (image  45, series 2) 16 mm short axis.   (Image 61, series 2) 20 mm short axis subcarinal lymph node.   (Image 75, series 2) 14 mm subcarinal nodal enlargement.   (Image 59, series 2) 26 mm RIGHT hilar lymph node.   LEFT hilar lymph nodes also enlarged but less enlarged than RIGHT hilar lymph nodes.   Smaller pre-vascular lymph nodes less than a cm. No frank adenopathy in the anterior mediastinum. No thoracic inlet adenopathy. No axillary lymphadenopathy.   Lungs/Pleura: No consolidation.  No pleural effusion.   Ground-glass nodule in the LEFT lung base (image 111, series 3) 9 x 8 mm. Limited assessment of this area due to motion artifact. Airways are patent.   Upper Abdomen: Incidental imaging of upper abdominal contents is unremarkable. Adrenal glands are normal. No upper  abdominal lymphadenopathy.   Musculoskeletal: No acute musculoskeletal process. No destructive bone process finding.   IMPRESSION: 1. Bulky adenopathy, bihilar, subcarinal and RIGHT paratracheal lymph nodes. Pattern of disease is compatible with provided history of sarcoidosis. Given that this is a diagnosis of exclusion would ensure correlation with prior pathology results. No prior imaging is available for review. 2. No septal thickening, bronchiectasis or peribronchovascular nodularity. No acute cardiopulmonary findings. 3. Ground-glass nodule in the LEFT lung base measuring 9 mm mean diameter. Mildly limited assessment due to motion artifact. Initial follow-up with CT at 6-12 months is recommended to confirm persistence. If persistent, repeat CT is recommended every 2 years until 5 years of stability has been established. This recommendation follows the consensus statement: Guidelines for Management of Incidental Pulmonary Nodules Detected on CT Images: From the Fleischner Society 2017; Radiology 2017; 284:228-243. 4. Aortic atherosclerosis.   Aortic Atherosclerosis (ICD10-I70.0).  06/11/20 TEE: IMPRESSIONS    1. Left ventricular ejection fraction, by estimation, is 60 to 65%. The  left ventricle has normal function. The left ventricle has no regional  wall motion abnormalities.   2. Right ventricular systolic function is normal. The right ventricular  size is normal. There is normal pulmonary artery systolic pressure.   3. Left atrial size was moderately dilated. No left atrial/left atrial  appendage thrombus was detected.   4. No prolapse. Carpentier type 3A restricted posterior leaflet motion  Pisa Radius 0.8 , ERO .39 and RV 55 suggest moderate to severe disease. 3D  VC 0.87 suggests worse but there appears to be two MR jets one central and  one lateral which makes  assessing 3D VC difficult There was blunted systolic forward flow in the  PV;s but clearly no reversal .  The mitral valve is abnormal. Moderate to  severe mitral valve regurgitation. No evidence of mitral stenosis.   5. Tricuspid valve regurgitation is moderate.   6. The aortic valve is tricuspid. Aortic valve regurgitation is not  visualized. No aortic stenosis is present.   7. The inferior vena cava is normal in size with greater than 50%  respiratory variability, suggesting right atrial pressure of 3 mmHg.   Conclusion(s)/Recommendation(s): Normal biventricular function without  evidence of hemodynamically significant valvular heart disease.   EKG:   11/19/2021: Sinus rhythm. Rate 93 bpm.  Recent Labs: 09/22/2022: Hemoglobin 12.4; Platelet Count 143   Recent Lipid Panel    Component Value Date/Time   CHOL 185 04/29/2022 0855   TRIG 69 04/29/2022 0855   HDL 57 04/29/2022 0855   CHOLHDL 3.2 04/29/2022 0855   LDLCALC 115 (H) 04/29/2022 0855     Risk Assessment/Calculations:  Physical Exam:    VS:  There were no vitals taken for this visit.    Wt Readings from Last 3 Encounters:  10/01/22 246 lb 6.4 oz (111.8 kg)  09/22/22 244 lb 3.2 oz (110.8 kg)  08/08/22 246 lb 9.6 oz (111.9 kg)     GEN: Well nourished, well developed in no acute distress HEENT: Normal NECK: No JVD; No carotid bruits CARDIAC: RRR, 2/6 blowing systolic murmur RESPIRATORY:  Clear to auscultation without rales, wheezing or rhonchi  MUSCULOSKELETAL:  No edema; No deformity  SKIN: Warm and dry NEUROLOGIC:  Alert and oriented x 3 PSYCHIATRIC:  Normal affect   ASSESSMENT:    No diagnosis found.  PLAN:    In order of problems listed above:  #Recurrent Pericarditis: Last episode 01/2021. Resolved with colchicine. No recurrence since that time. 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 Normal LVEF 60-65%. Normal RV. Normal PASP.  -Continue serial  monitoring with repeat TTEs every year with next 11/2022  #Hyperthyroidism s/p RAI: -Follow-up with endocrinology as scheduled  #HLD: Ca score 0 in [place] 3 years ago. Did not tolerate statins and declined starting cholesterol lowering therapy today. Can repeat Ca score in 2 years for monitoring.  -Ca score 0 about 3 years ago; can repeat in 2 years -Has not tolerated statins and declined cholesterol lowering therapy for now -Lifestyle modifications  #Obesity: BMI 39.8. Working on lifestyle modifications. -Lifestyle modifications as detailed below -Plans to see weight loss clinic  Exercise recommendations: Goal of exercising for at least 30 minutes a day, at least 5 times per week.  Please exercise to a moderate exertion.  This means that while exercising it is difficult to speak in full sentences, however you are not so short of breath that you feel you must stop, and not so comfortable that you can carry on a full conversation.  Exertion level should be approximately a 5/10, if 10 is the most exertion you can perform.  Diet recommendations: Recommend a heart healthy diet such as the Mediterranean diet.  This diet consists of plant based foods, healthy fats, lean meats, olive oil.  It suggests limiting the intake of simple carbohydrates such as white breads, pastries, and pastas.  It also limits the amount of red meat, wine, and dairy products such as cheese that one should consume on a daily basis.          Follow-up:  6 months  Medication Adjustments/Labs and Tests Ordered: Current medicines are reviewed at length with the patient today.  Concerns regarding medicines are outlined above.   No orders of the defined types were placed in this encounter.  No orders of the defined types were placed in this encounter.  There are no Patient Instructions on file for this visit.    Signed, Meriam Sprague, MD  11/05/2022 6:00 PM    [facility]

## 2022-11-13 ENCOUNTER — Encounter: Payer: Self-pay | Admitting: Cardiology

## 2022-11-13 ENCOUNTER — Ambulatory Visit: Payer: No Typology Code available for payment source | Attending: Cardiology | Admitting: Cardiology

## 2022-11-13 VITALS — BP 124/64 | HR 86 | Ht 65.0 in | Wt 243.8 lb

## 2022-11-13 DIAGNOSIS — D869 Sarcoidosis, unspecified: Secondary | ICD-10-CM

## 2022-11-13 DIAGNOSIS — I341 Nonrheumatic mitral (valve) prolapse: Secondary | ICD-10-CM

## 2022-11-13 DIAGNOSIS — I10 Essential (primary) hypertension: Secondary | ICD-10-CM

## 2022-11-13 DIAGNOSIS — I340 Nonrheumatic mitral (valve) insufficiency: Secondary | ICD-10-CM

## 2022-11-13 DIAGNOSIS — E785 Hyperlipidemia, unspecified: Secondary | ICD-10-CM

## 2022-11-13 DIAGNOSIS — I059 Rheumatic mitral valve disease, unspecified: Secondary | ICD-10-CM

## 2022-11-13 NOTE — Patient Instructions (Signed)
Medication Instructions:   Your physician recommends that you continue on your current medications as directed. Please refer to the Current Medication list given to you today.  *If you need a refill on your cardiac medications before your next appointment, please call your pharmacy*   Follow-Up: At  HeartCare, you and your health needs are our priority.  As part of our continuing mission to provide you with exceptional heart care, we have created designated Provider Care Teams.  These Care Teams include your primary Cardiologist (physician) and Advanced Practice Providers (APPs -  Physician Assistants and Nurse Practitioners) who all work together to provide you with the care you need, when you need it.  We recommend signing up for the patient portal called "MyChart".  Sign up information is provided on this After Visit Summary.  MyChart is used to connect with patients for Virtual Visits (Telemedicine).  Patients are able to view lab/test results, encounter notes, upcoming appointments, etc.  Non-urgent messages can be sent to your provider as well.   To learn more about what you can do with MyChart, go to [email or web address].    Your next appointment:   6 month(s)  The format for your next appointment:   In Person  Provider:   Dr. Pemberton   Important Information About Sugar       

## 2022-11-13 NOTE — Progress Notes (Signed)
Cardiology Office Note:    Date:  11/13/2022   ID:  Diana Calhoun, DOB 07/22/1956, MRN [id number]  PCP:  Thana Ates, MD   [facility] Providers Cardiologist:  Tobias Alexander, MD {   Referring MD: Renford Dills, MD    History of Present Illness:    Diana Calhoun is a 66 y.o. 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 Delton See who now returns to clinic for follow-up.  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 She also underwent calcium scoring which showed calcium score of 0. She had a prior echo  in 2019 that showed LVEF 55%, mild-moderate MR, mild TR, mildly elevated RVSP. 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 TEE was pursued with results below showing no prolapse but restricted posterior leaflet motion with moderate-severe MR and moderate TR. Repeat echo 10/17/20 was felt to be stable with EF 60-65%, grade 1 DD, mild-moderate LAE, posterior MV leaflet motion restricted with moderate MR without significant change from prior except no longer with elevated L or R atrial pressures.  Saw Dr. Delton See in 01/2021 where she was having pleuritic chest pain with concern for pericarditis. She could not take NSAIDs due to GERD. She was started on colchicine.   Seen in clinic on 11/2021 where she was doing well. Chest pain had resolved and she was off colchicine. Was having some mild DOE. TTE 11/2021 showed LVEF 60-65%, normal RV, normal PASP, moderate MR, RAP 3.  Was last seen on 04/2022 where she was working on weight loss. Was otherwise doing well from a CV standpoint.  Today, she reports that she is having a significant problem with asthma. She had a cold a bit ago, was negative for COVID and flu, but now she is having issues with asthma. She is on steroids currently.  She is still able to walk and tries to do 10-15 minutes a day. She has noticed that if her nose is congested, she will get pain in her neck and down through her L arm. It is only when she is laying down or sitting. She has no issues when she is walking. She has no problem climbing the 3 flights of stairs to her apartment.    Her cholesterol is slightly elevated but her cardiac scoring was 0 when she was living in [place]. She was on statin previously but had to discontinue due to intolerance. She had a lot of myalgias while on the medication.   She is also in a class to help her with her prediabetes. She is trying to manage with weight and diet management.   She denies any palpitations, chest pain or peripheral edema. No lightheadedness, headaches, syncope, orthopnea, or PND.   Past Medical History:  Diagnosis Date   Body mass index (BMI) of 30.0-30.9 in adult    Familial hypercholesterolemia    GERD (gastroesophageal reflux disease)    Hyperthyroidism    Mitral valve regurgitation    MVP (mitral valve prolapse)    Pulmonary sarcoidosis (HCC)    Tricuspid regurgitation    Past Surgical History:  Procedure Laterality Date   CESAREAN SECTION     PERCARDIUM INFUSION     TEE WITHOUT CARDIOVERSION N/A 06/11/2020   Procedure: TRANSESOPHAGEAL ECHOCARDIOGRAM (TEE);  Surgeon: Wendall Stade, MD;  Location: [facility] ENDOSCOPY;  Service: Cardiovascular;  Laterality: N/A;   Current Medications: Current Meds  Medication Sig   acetaminophen (TYLENOL) 500 MG tablet Take 500 mg by mouth every 6 (six) hours as needed.   albuterol (VENTOLIN HFA) 108 (90 Base) MCG/ACT inhaler Inhale 1-2 puffs into the lungs every 6 (six) hours as needed for wheezing or shortness of breath.   fluticasone (FLONASE) 50 MCG/ACT nasal spray Place 2 sprays into both nostrils in the morning and at bedtime. Try not to use right before bedtime.   fluticasone furoate-vilanterol (BREO ELLIPTA) 200-25 MCG/ACT AEPB Inhale 1 puff  into the lungs daily.   HYDROcodone bit-homatropine (HYCODAN) 5-1.5 MG/5ML syrup Take 5 mLs by mouth every 6 (six) hours as needed for cough.   pantoprazole (PROTONIX) 40 MG tablet Take 1 tablet every day by oral route.   tiZANidine (ZANAFLEX) 4 MG tablet Take 4 mg by mouth at bedtime.   Vitamin D, Ergocalciferol, (DRISDOL) 1.25 MG (50000 UNIT) CAPS capsule Take 50,000 Units by mouth once a week.    Allergies:   Shellfish allergy and Sulfa antibiotics   Social History   Socioeconomic History   Marital status: Single    Spouse name: Not on file   Number of children: Not on file   Years of education: Not on file   Highest education level: Not on file  Occupational History   Not on file  Tobacco Use   Smoking status: Never   Smokeless tobacco: Never  Substance and Sexual Activity   Alcohol use: Not on file   Drug use: Not on file   Sexual activity: Not on file  Other Topics Concern   Not on file  Social History Narrative   Not on file   Social Determinants of Health   Financial Resource Strain: Not on file  Food Insecurity: Not on file  Transportation Needs: Not on file  Physical Activity: Not on file  Stress: Not on file  Social Connections: Not on file     Family History: The patient's family history includes Diabetes in her sister and sister; Hypertension in her mother.  ROS:   Review of Systems  Constitutional:  Negative for chills and diaphoresis.  HENT:  Negative for congestion, hearing loss and nosebleeds.   Eyes:  Negative for double vision and pain.  Respiratory:  Positive for shortness of breath. Negative for hemoptysis and sputum production.   Cardiovascular:  Negative for chest pain, palpitations, orthopnea, claudication, leg swelling and PND.  Gastrointestinal:  Negative for diarrhea, nausea and vomiting.  Genitourinary:  Negative for dysuria and frequency.  Musculoskeletal:  Positive for myalgias (Lside of neck and into L arm). Negative for falls and  neck pain.  Skin:  Positive for rash (hx of sarcoidosis). Negative for itching.  Neurological:  Negative for dizziness, seizures and headaches.  Endo/Heme/Allergies:  Negative for polydipsia.  Psychiatric/Behavioral:  Negative for suicidal ideas. The patient does not have insomnia.    EKGs/Labs/Other Studies Reviewed:    The following studies were reviewed today: TTE Jan 14, 2022: IMPRESSIONS    1. Left ventricular ejection fraction, by estimation, is 60 to 65%. The  left ventricle has normal function. The left ventricle has no regional  wall motion abnormalities. Left ventricular diastolic parameters were  normal.   2. Right ventricular systolic function is normal. The right ventricular  size is normal. There is normal pulmonary artery systolic pressure.   3. Left atrial size was moderately dilated.   4. PISA 0.5 cm      ERO  0.14 cm2      MR Vol 24 ml. The mitral valve is rheumatic. Moderate mitral valve  regurgitation. No evidence of mitral stenosis. There is mild holosystolic  prolapse of the middle segment of the anterior leaflet of the mitral  valve.   5. The aortic valve is normal in structure. Aortic valve regurgitation is  not visualized. No aortic stenosis is present.   6. The inferior vena cava is normal in size with greater than 50%  respiratory variability, suggesting right atrial pressure of 3 mmHg.   Echo 10/17/20: 1. Left ventricular ejection fraction, by estimation, is 60 to 65%. The  left ventricle has normal function. The left ventricle has no regional  wall motion abnormalities. Left ventricular diastolic parameters are  consistent with Grade I diastolic  dysfunction (impaired relaxation).   2. Right ventricular systolic function is normal. The right ventricular  size is normal. Tricuspid regurgitation signal is inadequate for assessing  PA pressure.   3. Left atrial size was mild to moderately dilated.   4. Mitral valve changes suggest rheumatic or other  postinflammatory  change. The posterior leaflet motion is restricted. The mitral  regurgitation jet is very eccentric and its severity may be  underestimated. Moderate mitral valve regurgitation. No  evidence of mitral stenosis.   5. The aortic valve is normal in structure. Aortic valve regurgitation is  not visualized. No aortic stenosis is present.   6. The inferior vena cava is normal in size with greater than 50%  respiratory variability, suggesting right atrial pressure of 3 mmHg.   Comparison(s): Prior images reviewed side by side. There is no significant  change in left ventricular systolic function or mitral valve findings, but  there is no longer evidence of elevated left or right atrial pressures.   CT Chest 09/12/2020:  FINDINGS: Cardiovascular: Heart size is normal. No pericardial effusion. Aortic caliber is normal. Central pulmonary vasculature on venous phase assessment is unremarkable.   Mediastinum/Nodes: Bulky adenopathy, bi hilar, subcarinal and RIGHT paratracheal.   RIGHT paratracheal lymph node (image 45, series 2) 16 mm short axis.   (Image 61, series 2) 20 mm short axis subcarinal lymph node.   (Image 75, series 2) 14 mm subcarinal nodal enlargement.   (Image 59, series 2) 26 mm RIGHT hilar lymph node.   LEFT hilar lymph nodes also enlarged but less enlarged than RIGHT hilar lymph nodes.   Smaller pre-vascular lymph nodes less than a cm. No frank adenopathy in the anterior mediastinum. No thoracic inlet adenopathy. No axillary lymphadenopathy.   Lungs/Pleura: No consolidation.  No pleural effusion.   Ground-glass nodule in the LEFT lung base (image 111, series 3) 9 x 8 mm. Limited assessment of this area due to motion artifact. Airways are patent.   Upper Abdomen: Incidental imaging of upper abdominal contents is unremarkable. Adrenal glands are normal. No upper abdominal lymphadenopathy.   Musculoskeletal: No acute musculoskeletal process. No  destructive bone process finding.   IMPRESSION: 1. Bulky adenopathy, bihilar, subcarinal and RIGHT paratracheal lymph nodes. Pattern of disease is compatible with provided history of sarcoidosis. Given that this is a diagnosis of exclusion would ensure correlation with prior pathology results. No prior imaging is available for review. 2. No septal thickening, bronchiectasis or peribronchovascular nodularity. No acute cardiopulmonary findings. 3. Ground-glass nodule in the LEFT lung base measuring 9 mm mean diameter. Mildly limited assessment due to motion artifact. Initial follow-up with CT at 6-12 months is recommended to confirm persistence. If persistent, repeat CT is  recommended every 2 years until 5 years of stability has been established. This recommendation follows the consensus statement: Guidelines for Management of Incidental Pulmonary Nodules Detected on CT Images: From the Fleischner Society 2017; Radiology 2017; 284:228-243. 4. Aortic atherosclerosis.   Aortic Atherosclerosis (ICD10-I70.0).  06/11/20 TEE: IMPRESSIONS    1. Left ventricular ejection fraction, by estimation, is 60 to 65%. The  left ventricle has normal function. The left ventricle has no regional  wall motion abnormalities.   2. Right ventricular systolic function is normal. The right ventricular  size is normal. There is normal pulmonary artery systolic pressure.   3. Left atrial size was moderately dilated. No left atrial/left atrial  appendage thrombus was detected.   4. No prolapse. Carpentier type 3A restricted posterior leaflet motion  Pisa Radius 0.8 , ERO .39 and RV 55 suggest moderate to severe disease. 3D  VC 0.87 suggests worse but there appears to be two MR jets one central and  one lateral which makes  assessing 3D VC difficult There was blunted systolic forward flow in the  PV;s but clearly no reversal . The mitral valve is abnormal. Moderate to  severe mitral valve regurgitation. No  evidence of mitral stenosis.   5. Tricuspid valve regurgitation is moderate.   6. The aortic valve is tricuspid. Aortic valve regurgitation is not  visualized. No aortic stenosis is present.   7. The inferior vena cava is normal in size with greater than 50%  respiratory variability, suggesting right atrial pressure of 3 mmHg.   Conclusion(s)/Recommendation(s): Normal biventricular function without  evidence of hemodynamically significant valvular heart disease.   EKG:  EKG is personally reviewed.  11/13/2022: No new tracing 11/19/2021: Sinus rhythm. Rate 93 bpm.  Recent Labs: 09/22/2022: Hemoglobin 12.4; Platelet Count 143   Recent Lipid Panel    Component Value Date/Time   CHOL 185 04/29/2022 0855   TRIG 69 04/29/2022 0855   HDL 57 04/29/2022 0855   CHOLHDL 3.2 04/29/2022 0855   LDLCALC 115 (H) 04/29/2022 0855     Risk Assessment/Calculations:           Physical Exam:    VS:  BP 124/64   Pulse 86   Ht 5\' 5"  (1.651 m)   Wt 243 lb 12.8 oz (110.6 kg)   SpO2 97%   BMI 40.57 kg/m     Wt Readings from Last 3 Encounters:  11/13/22 243 lb 12.8 oz (110.6 kg)  10/01/22 246 lb 6.4 oz (111.8 kg)  09/22/22 244 lb 3.2 oz (110.8 kg)     GEN: Well nourished, well developed in no acute distress HEENT: Normal NECK: No JVD; No carotid bruits CARDIAC: RRR, 2/6 systolic murmur RESPIRATORY:  Clear to auscultation without rales, wheezing or rhonchi  MUSCULOSKELETAL:  No edema; No deformity  SKIN: Warm and dry NEUROLOGIC:  Alert and oriented x 3 PSYCHIATRIC:  Normal affect   ASSESSMENT:    1. Mitral valve disease   2. MVP (mitral valve prolapse)   3. Essential hypertension   4. Hyperlipidemia, unspecified hyperlipidemia type   5. Moderate mitral valve regurgitation   6. Sarcoidosis   7. Morbid obesity (HCC)    PLAN:    In order of problems listed above:  #Mitral Valve Disease with Restricted Leaflet Motion: #Moderate MR: TTE 11/2021 with mild holosystolic anterior  leaflet prolapse with moderate MR. Normal LVEF 60-65%. Normal RV. Normal PASP. Valve appears either postinflammatory (? Related to sarcoid) vs rheumatic. -Continue serial monitoring with repeat TTEs every year with  next 11/2022  #Recurrent Pericarditis: Last episode 01/2021. Resolved with colchicine. No recurrence since that time. -Continue to monitor -Off colchicine  #Hyperthyroidism s/p RAI: -Follow-up with endocrinology as scheduled  #HLD: Ca score 0 in [place] 3 years ago. Did not tolerate statins and declined starting cholesterol lowering therapy today. Can repeat Ca score in 2 years for monitoring.  -Ca score 0 about 3 years ago; can repeat testing in 2025 -Has not tolerated statins and declined cholesterol lowering therapy for now -Lifestyle modifications  #Obesity: BMI 40.5.Working on lifestyle modifications. -Lifestyle modifications as detailed below -Plans to see weight loss clinic  Exercise recommendations: Goal of exercising for at least 30 minutes a day, at least 5 times per week.  Please exercise to a moderate exertion.  This means that while exercising it is difficult to speak in full sentences, however you are not so short of breath that you feel you must stop, and not so comfortable that you can carry on a full conversation.  Exertion level should be approximately a 5/10, if 10 is the most exertion you can perform.  Diet recommendations: Recommend a heart healthy diet such as the Mediterranean diet.  This diet consists of plant based foods, healthy fats, lean meats, olive oil.  It suggests limiting the intake of simple carbohydrates such as white breads, pastries, and pastas.  It also limits the amount of red meat, wine, and dairy products such as cheese that one should consume on a daily basis.          Follow-up: 6 months  Medication Adjustments/Labs and Tests Ordered: Current medicines are reviewed at length with the patient today.  Concerns regarding  medicines are outlined above.   No orders of the defined types were placed in this encounter.  No orders of the defined types were placed in this encounter.  Patient Instructions  Medication Instructions:  Your physician recommends that you continue on your current medications as directed. Please refer to the Current Medication list given to you today.  *If you need a refill on your cardiac medications before your next appointment, please call your pharmacy*   Follow-Up: At [facility], you and your health needs are our priority.  As part of our continuing mission to provide you with exceptional heart care, we have created designated Provider Care Teams.  These Care Teams include your primary Cardiologist (physician) and Advanced Practice Providers (APPs -  Physician Assistants and Nurse Practitioners) who all work together to provide you with the care you need, when you need it.  We recommend signing up for the patient portal called "MyChart".  Sign up information is provided on this After Visit Summary.  MyChart is used to connect with patients for Virtual Visits (Telemedicine).  Patients are able to view lab/test results, encounter notes, upcoming appointments, etc.  Non-urgent messages can be sent to your provider as well.   To learn more about what you can do with MyChart, go to [email or web address].    Your next appointment:   6 month(s)  The format for your next appointment:   In Person  Provider:   Dr Shari ProwsPemberton   Important Information About Sugar           Jodelle GrossI,Jessica Ford,acting as a scribe for Meriam SpragueHeather E Kairi Tufo, MD.,have documented all relevant documentation on the behalf of Meriam SpragueHeather E Tionne Dayhoff, MD,as directed by  Meriam SpragueHeather E Tanyah Debruyne, MD while in the presence of Meriam SpragueHeather E Lajune Perine, MD.   I, Meriam SpragueHeather E Aragorn Recker, MD, have reviewed all  documentation for this visit. The documentation on 11/13/22 for the exam, diagnosis, procedures, and orders are all  accurate and complete.   Signed, Meriam Sprague, MD  11/13/2022 10:45 AM    [facility]

## 2022-11-14 ENCOUNTER — Encounter (HOSPITAL_BASED_OUTPATIENT_CLINIC_OR_DEPARTMENT_OTHER): Payer: No Typology Code available for payment source | Admitting: Dietician

## 2022-11-14 ENCOUNTER — Encounter: Payer: Self-pay | Admitting: Dietician

## 2022-11-14 DIAGNOSIS — R7303 Prediabetes: Secondary | ICD-10-CM

## 2022-11-14 NOTE — Progress Notes (Signed)
Patient was seen on 11/14/2022 for the Core Session 16 of Diabetes Prevention Program course at [facility]. By the end of this session patients are able to complete the following objectives:   Learning Objectives: Measure their progress toward weight and physical activity goals since Session 1.  Develop a plan for improving progress, if their goals have not yet been attained.  Describe ways to stay motivated long-term.   Goals:  Record weight taken outside of class.  Track foods and beverages eaten each day in the "Food and Activity Tracker," including calories and fat grams for each item.   Track activity type, minutes you were active, and distance you reached each day in the "Food and Activity Tracker."  Utilize action plan to help stay motivated and complete questions on "To Do List."   Follow-Up Plan: Attend session 17 in two weeks.  Bring completed "Food and Activity Tracker" next session to be reviewed by Lifestyle Coach.  

## 2022-11-19 DIAGNOSIS — J0101 Acute recurrent maxillary sinusitis: Secondary | ICD-10-CM | POA: Diagnosis not present

## 2022-11-28 ENCOUNTER — Encounter: Payer: Medicare HMO | Admitting: Dietician

## 2022-11-28 DIAGNOSIS — R058 Other specified cough: Secondary | ICD-10-CM | POA: Diagnosis not present

## 2022-11-28 DIAGNOSIS — J45909 Unspecified asthma, uncomplicated: Secondary | ICD-10-CM | POA: Diagnosis not present

## 2022-12-04 ENCOUNTER — Encounter: Payer: Self-pay | Admitting: Dietician

## 2022-12-04 ENCOUNTER — Encounter: Payer: Medicare HMO | Attending: Internal Medicine | Admitting: Dietician

## 2022-12-04 DIAGNOSIS — R7303 Prediabetes: Secondary | ICD-10-CM | POA: Insufficient documentation

## 2022-12-04 NOTE — Progress Notes (Signed)
Patient was seen on 12/04/2022 for Session 17 of Diabetes Prevention Program course at [facility]. By the end of this session patients are able to complete the following objectives:   Learning Objectives: Identify how to maintain and/or continue working toward program goals for the remainder of the program.  Describe ways that food and activity tracking can assist them in maintaining/reaching program goals.  Identify progress they have made since the beginning of the program.   Goals:  Record weight taken outside of class.  Track foods and beverages eaten each day in the "Food and Activity Tracker," including calories and fat grams for each item.   Track activity type, minutes you were active, and distance you reached each day in the "Food and Activity Tracker."   Follow-Up Plan: Attend session 18 in two weeks.  Bring completed "Food and Activity Trackers" next session to be reviewed by Lifestyle Coach.

## 2022-12-09 ENCOUNTER — Ambulatory Visit (HOSPITAL_COMMUNITY): Payer: Medicare HMO | Attending: Cardiology

## 2022-12-09 DIAGNOSIS — I059 Rheumatic mitral valve disease, unspecified: Secondary | ICD-10-CM | POA: Diagnosis not present

## 2022-12-09 DIAGNOSIS — I342 Nonrheumatic mitral (valve) stenosis: Secondary | ICD-10-CM

## 2022-12-09 DIAGNOSIS — I340 Nonrheumatic mitral (valve) insufficiency: Secondary | ICD-10-CM | POA: Insufficient documentation

## 2022-12-09 MED ORDER — PERFLUTREN LIPID MICROSPHERE
1.0000 mL | INTRAVENOUS | Status: AC | PRN
  Administered 2022-12-09: 2 mL via INTRAVENOUS

## 2022-12-10 LAB — ECHOCARDIOGRAM COMPLETE
Area-P 1/2: 3.54 cm2
MV M vel: 5.24 m/s
MV Peak grad: 109.9 mmHg
P 1/2 time: 103.8 msec
S' Lateral: 3.3 cm

## 2022-12-11 ENCOUNTER — Telehealth: Payer: Self-pay | Admitting: *Deleted

## 2022-12-11 DIAGNOSIS — I341 Nonrheumatic mitral (valve) prolapse: Secondary | ICD-10-CM

## 2022-12-11 DIAGNOSIS — I059 Rheumatic mitral valve disease, unspecified: Secondary | ICD-10-CM

## 2022-12-11 DIAGNOSIS — I340 Nonrheumatic mitral (valve) insufficiency: Secondary | ICD-10-CM

## 2022-12-11 DIAGNOSIS — I050 Rheumatic mitral stenosis: Secondary | ICD-10-CM

## 2022-12-11 DIAGNOSIS — I071 Rheumatic tricuspid insufficiency: Secondary | ICD-10-CM

## 2022-12-11 NOTE — Telephone Encounter (Signed)
The patient has been notified of the result and verbalized understanding.  All questions (if any) were answered.  Pt aware we will order for her to get another echo done in one year for surveillance of her valves.  She is aware that I will go ahead and place the order for repeat echo in one year in the system and send a message to our echo scheduler to call her back and arrange this appt for that time.   Pt verbalized understanding and agrees with this plan.

## 2022-12-11 NOTE — Telephone Encounter (Signed)
-----  Message from Freada Bergeron, MD sent at 12/11/2022 12:26 PM EST ----- Echo shows normal pumping function with moderate leakiness of the mitral valve and mild narrowing of the mitral valve. There is also mild to moderate leakiness of her tricuspid valve. These do not need treatment at this time but we need to keep a close eye on things going forward. Can we repeat the echo in 1 year?

## 2022-12-12 ENCOUNTER — Encounter: Payer: Self-pay | Admitting: Dietician

## 2022-12-12 ENCOUNTER — Encounter: Payer: Medicare HMO | Admitting: Dietician

## 2022-12-12 DIAGNOSIS — E0500 Thyrotoxicosis with diffuse goiter without thyrotoxic crisis or storm: Secondary | ICD-10-CM | POA: Diagnosis not present

## 2022-12-12 DIAGNOSIS — R7303 Prediabetes: Secondary | ICD-10-CM

## 2022-12-12 NOTE — Progress Notes (Signed)
Patient was seen on 12/12/2022 for the Diabetes Prevention Program course at [facility]. By the end of this session patients are able to complete the following objectives:   Learning Objectives: Describe the differences between unsaturated, saturated, and trans fat on heart health.  List dietary sources of unsaturated, saturated, and trans fats. Explain ways to reduce intake of saturated fat and replace them with heart healthy fats.  Goals:  Record weight taken outside of class.  Track foods and beverages eaten each day in the "Food and Activity Tracker," including calories and fat grams for each item.   Track activity type, minutes you were active, and distance you reached each day in the "Food and Activity Tracker."   Follow-Up Plan: Attend next session.  Bring completed "Food and Activity Trackers" to next session to be reviewed by Lifestyle Coach.  

## 2022-12-19 ENCOUNTER — Encounter: Payer: Self-pay | Admitting: Dietician

## 2022-12-19 ENCOUNTER — Encounter: Payer: Medicare HMO | Attending: Internal Medicine | Admitting: Dietician

## 2022-12-19 DIAGNOSIS — R7303 Prediabetes: Secondary | ICD-10-CM | POA: Insufficient documentation

## 2022-12-19 NOTE — Progress Notes (Signed)
Patient was seen on 12/19/2022 for the Diabetes Prevention Program course at [facility]. By the end of this session patients are able to complete the following objectives:   Learning Objectives: Define fiber and describe the difference between insoluble and soluble fiber  List foods that are good sources of fiber Explain the health benefits of fiber  Describe ways to increase volume of meals and snacks while staying within fat goal.   Goals:  Record weight taken outside of class.  Track foods and beverages eaten each day in the "Food and Activity Tracker," including calories and fat grams for each item.   Track activity type, minutes you were active, and distance you reached each day in the "Food and Activity Tracker."   Follow-Up Plan: Attend next session.  Bring completed "Food and Activity Trackers" to next session to be reviewed by Lifestyle Coach.  

## 2022-12-29 DIAGNOSIS — R7303 Prediabetes: Secondary | ICD-10-CM | POA: Diagnosis not present

## 2022-12-29 DIAGNOSIS — G4709 Other insomnia: Secondary | ICD-10-CM | POA: Diagnosis not present

## 2022-12-29 DIAGNOSIS — R400 Somnolence: Secondary | ICD-10-CM | POA: Diagnosis not present

## 2022-12-29 DIAGNOSIS — E785 Hyperlipidemia, unspecified: Secondary | ICD-10-CM | POA: Diagnosis not present

## 2022-12-29 DIAGNOSIS — D7589 Other specified diseases of blood and blood-forming organs: Secondary | ICD-10-CM | POA: Diagnosis not present

## 2022-12-29 DIAGNOSIS — J309 Allergic rhinitis, unspecified: Secondary | ICD-10-CM | POA: Diagnosis not present

## 2023-01-02 ENCOUNTER — Encounter: Payer: Medicare HMO | Admitting: Dietician

## 2023-01-23 ENCOUNTER — Encounter: Payer: Medicare HMO | Attending: Internal Medicine | Admitting: Dietician

## 2023-01-23 ENCOUNTER — Encounter: Payer: Self-pay | Admitting: Dietician

## 2023-01-23 DIAGNOSIS — R7303 Prediabetes: Secondary | ICD-10-CM | POA: Insufficient documentation

## 2023-01-23 NOTE — Progress Notes (Signed)
On 01/23/2023 patient completed a post core session of the Diabetes Prevention Program with [facility]. By the end of this session patients are able to complete the following objectives:   Learning Objectives: List indoor physical activity options.  Identify any barriers to being active and brainstorm how to overcome barriers.  Describe short and long-term health benefits of physical activity.   Goals:  Record weight taken outside of class.  Track foods and beverages eaten each day in the "Food and Activity Tracker," including calories and fat grams for each item.   Track activity type, minutes you were active, and distance you reached each day in the "Food and Activity Tracker."   Follow-Up Plan: Attend next session.  Bring completed "Food and Activity Trackers" to next session to be reviewed by Lifestyle Coach.

## 2023-01-28 DIAGNOSIS — Z809 Family history of malignant neoplasm, unspecified: Secondary | ICD-10-CM | POA: Diagnosis not present

## 2023-01-28 DIAGNOSIS — Z7951 Long term (current) use of inhaled steroids: Secondary | ICD-10-CM | POA: Diagnosis not present

## 2023-01-28 DIAGNOSIS — Z6839 Body mass index (BMI) 39.0-39.9, adult: Secondary | ICD-10-CM | POA: Diagnosis not present

## 2023-01-28 DIAGNOSIS — R030 Elevated blood-pressure reading, without diagnosis of hypertension: Secondary | ICD-10-CM | POA: Diagnosis not present

## 2023-01-28 DIAGNOSIS — Z8249 Family history of ischemic heart disease and other diseases of the circulatory system: Secondary | ICD-10-CM | POA: Diagnosis not present

## 2023-01-28 DIAGNOSIS — K219 Gastro-esophageal reflux disease without esophagitis: Secondary | ICD-10-CM | POA: Diagnosis not present

## 2023-01-28 DIAGNOSIS — M1990 Unspecified osteoarthritis, unspecified site: Secondary | ICD-10-CM | POA: Diagnosis not present

## 2023-01-28 DIAGNOSIS — J302 Other seasonal allergic rhinitis: Secondary | ICD-10-CM | POA: Diagnosis not present

## 2023-01-28 DIAGNOSIS — K5900 Constipation, unspecified: Secondary | ICD-10-CM | POA: Diagnosis not present

## 2023-01-28 DIAGNOSIS — Z7952 Long term (current) use of systemic steroids: Secondary | ICD-10-CM | POA: Diagnosis not present

## 2023-01-28 DIAGNOSIS — J4489 Other specified chronic obstructive pulmonary disease: Secondary | ICD-10-CM | POA: Diagnosis not present

## 2023-02-09 ENCOUNTER — Other Ambulatory Visit: Payer: Self-pay | Admitting: Primary Care

## 2023-02-27 ENCOUNTER — Encounter: Payer: Self-pay | Admitting: Dietician

## 2023-02-27 ENCOUNTER — Encounter: Payer: Medicare Other | Attending: Internal Medicine | Admitting: Dietician

## 2023-02-27 DIAGNOSIS — R7303 Prediabetes: Secondary | ICD-10-CM | POA: Insufficient documentation

## 2023-02-27 NOTE — Progress Notes (Signed)
Patient was seen on 02/27/2023 for the Diabetes Prevention Program course at [facility]. By the end of this session patients are able to complete the following objectives:   Learning Objectives: Counter self-defeating thoughts with positive self-statements Define assertiveness.  List examples of ways to practice assertiveness.   Goals:  Record weight taken outside of class.  Track foods and beverages eaten each day in the "Food and Activity Tracker," including calories and fat grams for each item.   Track activity type, minutes you were active, and distance you reached each day in the "Food and Activity Tracker."   Follow-Up Plan: Attend next session.  Bring completed "Food and Activity Trackers" to next session to be reviewed by Lifestyle Coach.

## 2023-03-03 ENCOUNTER — Encounter: Payer: Self-pay | Admitting: Emergency Medicine

## 2023-03-03 ENCOUNTER — Ambulatory Visit: Payer: Medicare Other | Admitting: Emergency Medicine

## 2023-03-03 VITALS — BP 120/82 | HR 62 | Temp 97.6°F | Ht 65.0 in | Wt 241.0 lb

## 2023-03-03 DIAGNOSIS — K219 Gastro-esophageal reflux disease without esophagitis: Secondary | ICD-10-CM | POA: Diagnosis not present

## 2023-03-03 DIAGNOSIS — J4489 Other specified chronic obstructive pulmonary disease: Secondary | ICD-10-CM

## 2023-03-03 DIAGNOSIS — R053 Chronic cough: Secondary | ICD-10-CM

## 2023-03-03 DIAGNOSIS — D869 Sarcoidosis, unspecified: Secondary | ICD-10-CM

## 2023-03-03 DIAGNOSIS — J301 Allergic rhinitis due to pollen: Secondary | ICD-10-CM

## 2023-03-03 MED ORDER — BENZONATATE 100 MG PO CAPS: 100.0000 mg | ORAL_CAPSULE | Freq: Four times a day (QID) | ORAL | 1 refills | Status: DC | PRN

## 2023-03-03 MED ORDER — ALBUTEROL SULFATE HFA 108 (90 BASE) MCG/ACT IN AERS: 1.0000 | INHALATION_SPRAY | Freq: Four times a day (QID) | RESPIRATORY_TRACT | 5 refills | Status: DC | PRN

## 2023-03-03 NOTE — Assessment & Plan Note (Signed)
Continue Zyrtec once daily Continue Singulair 10 mg each evening Continue fluticasone nasal spray, 2 sprays each nostril twice a day Try getting chlorpheniramine (chlor tabs) 4 mg over-the-counter.  You can use this up to every 6 hours if needed for breakthrough allergy symptoms

## 2023-03-03 NOTE — Patient Instructions (Addendum)
We will plan to repeat your CT scan of the chest without contrast in November 2024 to follow sarcoidosis Continue your Virgel Bouquet once daily.  Rinse and gargle after using. We will refill your albuterol.  Keep this available to use 2 puffs if needed for shortness of breath, chest tightness, wheezing.  You could consider pretreating your exercise with 2 puffs about 10 minutes before Continue Zyrtec once daily Continue Singulair 10 mg each evening Continue fluticasone nasal spray, 2 sprays each nostril twice a day Try getting chlorpheniramine (chlor tabs) 4 mg over-the-counter.  You can use this up to every 6 hours if needed for breakthrough allergy symptoms Continue your Protonix as you have been taking it Try using Tessalon Perles 100 mg up to every 6 hours if needed for cough suppression. Follow Dr. Delton Coombes in November after your CT chest so we can review the results together.

## 2023-03-03 NOTE — Assessment & Plan Note (Signed)
Plan for repeat CT in November 2024 to follow for interval stability.  She does intermittently have some skin findings, seem to respond to steroid cream

## 2023-03-03 NOTE — Progress Notes (Signed)
Subjective:    Patient ID: Diana Calhoun, female    DOB: 03-09-1956, 67 y.o.   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  PMH also significant for hyperthyroidism. She underwent surveillance CT scan of the chest on 09/25/2022 as below.  Her last pulmonary function testing was done and January 2022 She has been dealing with Currently managed on Breo, Singulair, fluticasone nasal spray.  We will try starting loratadine last time but she notes persistent nasal congestion and drainage on this. She also tried Care[employer] adviser. Wants to try zyrtec. Using albuterol qd. No rash. She has had some thrombocytopenia.    CT chest 09/25/2022 reviewed by me, shows stable mediastinal and hilar lymphadenopathy without significant change compared with 08/2020.  Resolution of some left basilar groundglass inflammatory change.  Note was made of an incompletely visualized 5.2 cm low-density right renal lesion, likely a cyst.  Renal ultrasound was recommended.  ROV 03/03/2023 --follow-up visit for Diana Calhoun.  She is 67 with a history of sarcoidosis and cardiac and cutaneous involvement, mediastinal adenopathy, obstructive lung disease in an asthma pattern.  She has chronic cough as well with a history of GERD and chronic rhinitis. We have been managing her on Breo, she ran out of her albuterol.  Zyrtec, Singulair, not on chlorpheniramine, fluticasone nasal spray bid She will intermittently get rash on her calves, L chest, upper back. Uses steroid cream with mixed results. She is having more sneezing, eye itching, throat irritation. She has some dysp-nea and wheez in th am when she gets up. Some dyspnea w walking a distance. Occasional GERD sx on protonix qd.  Needs CT chest in November.   Review of Systems As per HPI      Objective:    Physical Exam  Vitals:   03/03/23 1020  BP: 120/82  Pulse: 62  Temp: 97.6 F (36.4 C)  TempSrc: Oral  SpO2: 95%  Weight: 241 lb (109.3 kg)  Height:  (1.651 m)   Gen: Pleasant, overwt woman, in no distress,  normal affect  ENT: No lesions,  mouth clear,  oropharynx clear, no postnasal drip  Neck: No JVD, no stridor  Lungs: No use of accessory muscles, no crackles or wheezing on normal respiration, no wheeze on forced expiration  Cardiovascular: RRR, heart sounds normal, no murmur or gallops, no peripheral edema  Musculoskeletal: No deformities, no cyanosis or clubbing  Neuro: alert, awake, non focal  Skin: Warm, no rash     Assessment & Plan:  Asthma with COPD (HCC) Continue your Breo once daily.  Rinse and gargle after using. We will refill your albuterol.  Keep this available to use 2 puffs if needed for shortness of breath, chest tightness, wheezing.  You could consider pretreating your exercise with 2 puffs about 10 minutes before  Allergic rhinitis Continue Zyrtec once daily Continue Singulair 10 mg each evening Continue fluticasone nasal spray, 2 sprays each nostril twice a day Try getting chlorpheniramine (chlor tabs) 4 mg over-the-counter.  You can use this up to every 6 hours if needed for breakthrough allergy symptoms  Sarcoidosis Plan for repeat CT in November 2024 to follow for interval stability.  She does intermittently have some skin findings, seem to respond to steroid cream  GERD (gastroesophageal reflux disease) Continue your Protonix as you have been taking it  Cough Continue  treatment of her asthma, GERD, allergic rhinitis. Add tessalon perles for symptom management   Levy Pupa, MD, PhD 03/03/2023, 11:30 AM [facility] [phone] or if no answer [phone]

## 2023-03-03 NOTE — Assessment & Plan Note (Signed)
Continue your Breo once daily.  Rinse and gargle after using. We will refill your albuterol.  Keep this available to use 2 puffs if needed for shortness of breath, chest tightness, wheezing.  You could consider pretreating your exercise with 2 puffs about 10 minutes before

## 2023-03-03 NOTE — Assessment & Plan Note (Signed)
Continue your Protonix as you have been taking it. ?

## 2023-03-03 NOTE — Assessment & Plan Note (Signed)
Continue treatment of her asthma, GERD, allergic rhinitis. Add tessalon perles for symptom management

## 2023-03-27 ENCOUNTER — Encounter: Payer: Medicare Other | Attending: Internal Medicine | Admitting: Dietician

## 2023-03-27 ENCOUNTER — Encounter: Payer: Self-pay | Admitting: Dietician

## 2023-03-27 DIAGNOSIS — R7303 Prediabetes: Secondary | ICD-10-CM

## 2023-03-27 NOTE — Progress Notes (Signed)
Patient was seen on 03/27/2023 for Session 22 of Diabetes Prevention Program course at [facility]. By the end of this session patients are able to complete the following objectives:   Learning Objectives: Describe the difference between a lapse and a relapse. List steps to prevent a lapse from becoming a relapse.  Identify situations that increase risk of having a lapse.  Make a plan to help prevent lapses and recover after a lapse has occurred.   Goals:  Record weight taken outside of class.  Track foods and beverages eaten each day in the "Food and Activity Tracker," including calories and fat grams for each item.   Track activity type, minutes you were active, and distance you reached each day in the "Food and Activity Tracker."   Follow-Up Plan: Attend session 23 Bring completed "Food and Activity Trackers" to next session to be reviewed by Lifestyle Coach.  

## 2023-04-10 DIAGNOSIS — H04123 Dry eye syndrome of bilateral lacrimal glands: Secondary | ICD-10-CM | POA: Diagnosis not present

## 2023-04-10 DIAGNOSIS — H2513 Age-related nuclear cataract, bilateral: Secondary | ICD-10-CM | POA: Diagnosis not present

## 2023-04-10 DIAGNOSIS — H02834 Dermatochalasis of left upper eyelid: Secondary | ICD-10-CM | POA: Diagnosis not present

## 2023-04-10 DIAGNOSIS — H35413 Lattice degeneration of retina, bilateral: Secondary | ICD-10-CM | POA: Diagnosis not present

## 2023-04-10 DIAGNOSIS — H5213 Myopia, bilateral: Secondary | ICD-10-CM | POA: Diagnosis not present

## 2023-04-10 DIAGNOSIS — H25013 Cortical age-related cataract, bilateral: Secondary | ICD-10-CM | POA: Diagnosis not present

## 2023-04-10 DIAGNOSIS — H52203 Unspecified astigmatism, bilateral: Secondary | ICD-10-CM | POA: Diagnosis not present

## 2023-04-10 DIAGNOSIS — H02831 Dermatochalasis of right upper eyelid: Secondary | ICD-10-CM | POA: Diagnosis not present

## 2023-04-10 DIAGNOSIS — H02402 Unspecified ptosis of left eyelid: Secondary | ICD-10-CM | POA: Diagnosis not present

## 2023-04-13 NOTE — Progress Notes (Signed)
Cardiology Office Note:    Date:  04/17/2023   ID:  Diana Calhoun, DOB 1956/10/08, MRN [id number]  PCP:  Thana Ates, MD   [facility] Providers Cardiologist:  Tobias Alexander, MD {   Referring MD: Thana Ates, MD    History of Present Illness:    Diana Calhoun is a 67 y.o. 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 Delton See who now returns to clinic for follow-up.  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 She also underwent calcium scoring which showed calcium score of 0. She had a prior echo  in 2019 that showed LVEF 55%, mild-moderate MR, mild TR, mildly elevated RVSP. 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 TEE was pursued with results below showing no prolapse but restricted posterior leaflet motion with moderate-severe MR and moderate TR. Repeat echo 10/17/20 was felt to be stable with EF 60-65%, grade 1 DD, mild-moderate LAE, posterior MV leaflet motion restricted with moderate MR without significant change from prior except no longer with elevated L or R atrial pressures.  Saw Dr. Delton See in 01/2021 where she was having pleuritic chest pain with concern for pericarditis. She could not take NSAIDs due to GERD. She was started on colchicine.   Seen in clinic on 11/2021 where she was doing well. Chest pain had resolved and she was off colchicine. Was having some mild DOE. TTE 11/2021 showed LVEF 60-65%, normal RV, normal PASP, moderate MR, RAP 3.  Was last seen on 10/2022 where she was struggling with her asthma but was otherwise stable from a CV standpoint. TTE 11/2022 for monitoring of MR with EF 55-60%, normal RV, normal RVSP, post-inflammatory appearanece of MR with moderate MR, mild MS with average mean gradient 4.63mmHg.  Today, the patient overall  feels well. She has been struggling with seasonal allergies which has been exacerbating her asthma. She is following closely with Pulm. No chest pain, LE edema, orthopnea, or PND. She is trying to walk per day and feels well with exercise.   Admits that her diet is not the best.   Past Medical History:  Diagnosis Date   Body mass index (BMI) of 30.0-30.9 in adult    Familial hypercholesterolemia    GERD (gastroesophageal reflux disease)    Hyperthyroidism    Mitral valve regurgitation    MVP (mitral valve prolapse)    Pulmonary sarcoidosis (HCC)    Tricuspid regurgitation    Past Surgical History:  Procedure Laterality Date   CESAREAN SECTION     PERCARDIUM INFUSION     TEE WITHOUT CARDIOVERSION N/A 06/11/2020   Procedure: TRANSESOPHAGEAL ECHOCARDIOGRAM (TEE);  Surgeon: Wendall Stade, MD;  Location: [facility] ENDOSCOPY;  Service: Cardiovascular;  Laterality: N/A;   Current Medications: Current Meds  Medication Sig   acetaminophen (TYLENOL) 500 MG tablet Take 500 mg by mouth every 6 (six) hours as needed.   albuterol (VENTOLIN HFA) 108 (90 Base) MCG/ACT inhaler Inhale 1-2 puffs into the lungs every 6 (six) hours as needed for wheezing or shortness of breath.   benzonatate (TESSALON) 100 MG capsule Take 1 capsule (100 mg total) by mouth every 6 (six) hours as needed for cough.   BREO ELLIPTA 200-25 MCG/ACT AEPB INHALE ONE DOSE BY MOUTH INTO LUNGS DAILY   ezetimibe (ZETIA) 10 MG tablet Take  1 tablet (10 mg total) by mouth daily.   fluticasone (FLONASE) 50 MCG/ACT nasal spray Place 2 sprays into both nostrils in the morning and at bedtime. Try not to use right before bedtime.   HYDROcodone bit-homatropine (HYCODAN) 5-1.5 MG/5ML syrup Take 5 mLs by mouth every 6 (six) hours as needed for cough.   pantoprazole (PROTONIX) 40 MG tablet Take 1 tablet every day by oral route.   tiZANidine (ZANAFLEX) 4 MG tablet Take 4 mg by mouth at bedtime.   Vitamin D, Ergocalciferol, (DRISDOL) 1.25 MG  (50000 UNIT) CAPS capsule Take 50,000 Units by mouth once a week.    Allergies:   Shellfish allergy and Sulfa antibiotics   Social History   Socioeconomic History   Marital status: Single    Spouse name: Not on file   Number of children: Not on file   Years of education: Not on file   Highest education level: Not on file  Occupational History   Not on file  Tobacco Use   Smoking status: Never   Smokeless tobacco: Never  Substance and Sexual Activity   Alcohol use: Not on file   Drug use: Not on file   Sexual activity: Not on file  Other Topics Concern   Not on file  Social History Narrative   Not on file   Social Determinants of Health   Financial Resource Strain: Not on file  Food Insecurity: Not on file  Transportation Needs: Not on file  Physical Activity: Not on file  Stress: Not on file  Social Connections: Not on file     Family History: The patient's family history includes Diabetes in her sister and sister; Hypertension in her mother.  ROS:   As per HPI  EKGs/Labs/Other Studies Reviewed:    The following studies were reviewed today: Cardiac Studies & Procedures       ECHOCARDIOGRAM  ECHOCARDIOGRAM COMPLETE 12/10/2022  Narrative ECHOCARDIOGRAM REPORT    Patient Name:   Diana Calhoun Date of Exam: 12/09/2022 Medical Rec #:  [id number]     Height:       65.0 in Accession #:    [id number]    Weight:       243.8 lb Date of Birth:  1956/01/26    BSA:          2.152 m Patient Age:    66 years      BP:           124/64 mmHg Patient Gender: F             HR:           75 bpm. Exam Location:  [address]  Procedure: 2D Echo, Cardiac Doppler, Color Doppler and Intracardiac Opacification Agent  Indications:    I05.9 Mitral Valve Disorder  History:        Patient has prior history of Echocardiogram examinations, most recent 12/10/2021.  Sonographer:    Daphine Deutscher RDCS Referring Phys: [id number] Kitt Ledet E Niccolo Burggraf  IMPRESSIONS   1.  Left ventricular ejection fraction, by estimation, is 55 to 60%. The left ventricle has normal function. The left ventricle has no regional wall motion abnormalities. Left ventricular diastolic parameters were normal. 2. Right ventricular systolic function is normal. The right ventricular size is normal. There is normal pulmonary artery systolic pressure. The estimated right ventricular systolic pressure is 34.6 mmHg. 3. Left atrial size was mildly dilated. 4. Post-inflammatory appearance of the mitral valve. Restricted posterior leaflet motion with relative anterior leaflet override,  resulting in a posteriorly directed jet of MR. The mitral valve is abnormal. Moderate mitral valve regurgitation. Mild mitral stenosis. The mean mitral valve gradient is 4.6 mmHg with average heart rate of 74 bpm. MVA by PHT 2.1 cm2. 5. Tricuspid valve regurgitation is mild to moderate. 6. The aortic valve is grossly normal. Aortic valve regurgitation is not visualized. No aortic stenosis is present. 7. The inferior vena cava is normal in size with greater than 50% respiratory variability, suggesting right atrial pressure of 3 mmHg.  FINDINGS Left Ventricle: Left ventricular ejection fraction, by estimation, is 55 to 60%. The left ventricle has normal function. The left ventricle has no regional wall motion abnormalities. Definity contrast agent was given IV to delineate the left ventricular endocardial borders. The left ventricular internal cavity size was normal in size. There is no left ventricular hypertrophy. Left ventricular diastolic parameters were normal.  Right Ventricle: The right ventricular size is normal. No increase in right ventricular wall thickness. Right ventricular systolic function is normal. There is normal pulmonary artery systolic pressure. The tricuspid regurgitant velocity is 2.81 m/s, and with an assumed right atrial pressure of 3 mmHg, the estimated right ventricular systolic pressure is 34.6  mmHg.  Left Atrium: Left atrial size was mildly dilated.  Right Atrium: Right atrial size was normal in size.  Pericardium: There is no evidence of pericardial effusion.  Mitral Valve: Post-inflammatory appearance of the mitral valve. Restricted posterior leaflet motion with relative anterior leaflet override, resulting in a posteriorly directed jet of MR. The mitral valve is abnormal. Moderate mitral valve regurgitation. Mild mitral valve stenosis. The mean mitral valve gradient is 4.6 mmHg with average heart rate of 74 bpm.  Tricuspid Valve: The tricuspid valve is normal in structure. Tricuspid valve regurgitation is mild to moderate. No evidence of tricuspid stenosis.  Aortic Valve: The aortic valve is grossly normal. Aortic valve regurgitation is not visualized. No aortic stenosis is present.  Pulmonic Valve: The pulmonic valve was normal in structure. Pulmonic valve regurgitation is trivial. No evidence of pulmonic stenosis.  Aorta: The aortic root is normal in size and structure.  Venous: The inferior vena cava is normal in size with greater than 50% respiratory variability, suggesting right atrial pressure of 3 mmHg.  IAS/Shunts: No atrial level shunt detected by color flow Doppler.   LEFT VENTRICLE PLAX 2D LVIDd:         5.00 cm   Diastology LVIDs:         3.30 cm   LV e' medial:    10.53 cm/s LV PW:         0.80 cm   LV E/e' medial:  14.4 LV IVS:        0.80 cm   LV e' lateral:   13.15 cm/s LVOT diam:     1.90 cm   LV E/e' lateral: 11.6 LV SV:         52 LV SV Index:   24 LVOT Area:     2.84 cm   RIGHT VENTRICLE RV Basal diam:  3.40 cm RV S prime:     14.50 cm/s TAPSE (M-mode): 2.6 cm  LEFT ATRIUM             Index        RIGHT ATRIUM           Index LA diam:        5.00 cm 2.32 cm/m   RA Area:     13.40 cm LA Vol (  A2C):   53.3 ml 24.77 ml/m  RA Volume:   31.00 ml  14.41 ml/m LA Vol (A4C):   87.6 ml 40.71 ml/m LA Biplane Vol: 75.0 ml 34.85 ml/m AORTIC  VALVE LVOT Vmax:   93.77 cm/s LVOT Vmean:  60.500 cm/s LVOT VTI:    0.185 m  AORTA Ao Root diam: 3.00 cm Ao Asc diam:  3.00 cm  MITRAL VALVE                TRICUSPID VALVE MV Area (PHT): 3.54 cm     TR Peak grad:   31.6 mmHg MV Mean grad:  4.6 mmHg     TR Vmax:        281.00 cm/s MV PHT:        103.76 msec MV Decel Time: 215 msec     SHUNTS MR Peak grad: 109.9 mmHg    Systemic VTI:  0.18 m MR Mean grad: 79.4 mmHg     Systemic Diam: 1.90 cm MR Vmax:      524.20 cm/s MR Vmean:     427.4 cm/s MV E velocity: 152.00 cm/s MV A velocity: 139.50 cm/s MV E/A ratio:  1.09  Weston Brass MD Electronically signed by Weston Brass MD Signature Date/Time: 12/10/2022/9:46:08 AM    Final   TEE  ECHO TEE 06/11/2020  Narrative TRANSESOPHOGEAL ECHO REPORT    Patient Name:   Diana Calhoun Date of Exam: 06/11/2020 Medical Rec #:  [id number]     Height:       65.0 in Accession #:    [id number]    Weight:       237.0 lb Date of Birth:  [birth year]    BSA:          2.126 m Patient Age:    63 years      BP:           128/72 mmHg Patient Gender: F             HR:           92 bpm. Exam Location:  Inpatient  Procedure: 3D Echo, Transesophageal Echo, Cardiac Doppler and Color Doppler  Indications:     Mitral Regurgitation  History:         Patient has prior history of Echocardiogram examinations, most recent 05/31/2020. Risk Factors:Dyslipidemia and Non-Smoker. MR. GERD.  Sonographer:     Renella Cunas RDCS Referring Phys:  [id number] Tarri Abernethy [facility] Diagnosing Phys: Charlton Haws MD  PROCEDURE: The transesophogeal probe was passed without difficulty through the esophogus of the patient. Local oropharyngeal anesthetic was provided with Cetacaine. Sedation performed by different physician. The patient was monitored while under deep sedation. Anesthestetic sedation was provided intravenously by Anesthesiology: 144.06mg  of Propofol, 60mg  of Lidocaine. The patient's vital signs; including heart  rate, blood pressure, and oxygen saturation; remained stable throughout the procedure. The patient developed no complications during the procedure.  IMPRESSIONS   1. Left ventricular ejection fraction, by estimation, is 60 to 65%. The left ventricle has normal function. The left ventricle has no regional wall motion abnormalities. 2. Right ventricular systolic function is normal. The right ventricular size is normal. There is normal pulmonary artery systolic pressure. 3. Left atrial size was moderately dilated. No left atrial/left atrial appendage thrombus was detected. 4. No prolapse. Carpentier type 3A restricted posterior leaflet motion Pisa Radius 0.8 , ERO .39 and RV 55 suggest moderate to severe disease. 3D VC 0.87 suggests worse but there appears to be two MR  jets one central and one lateral which makes assessing 3D VC difficult There was blunted systolic forward flow in the PV;s but clearly no reversal . The mitral valve is abnormal. Moderate to severe mitral valve regurgitation. No evidence of mitral stenosis. 5. Tricuspid valve regurgitation is moderate. 6. The aortic valve is tricuspid. Aortic valve regurgitation is not visualized. No aortic stenosis is present. 7. The inferior vena cava is normal in size with greater than 50% respiratory variability, suggesting right atrial pressure of 3 mmHg.  Conclusion(s)/Recommendation(s): Normal biventricular function without evidence of hemodynamically significant valvular heart disease.  FINDINGS Left Ventricle: Left ventricular ejection fraction, by estimation, is 60 to 65%. The left ventricle has normal function. The left ventricle has no regional wall motion abnormalities. The left ventricular internal cavity size was normal in size. There is no left ventricular hypertrophy.  Right Ventricle: The right ventricular size is normal. No increase in right ventricular wall thickness. Right ventricular systolic function is normal. There is normal  pulmonary artery systolic pressure. The tricuspid regurgitant velocity is 2.31 m/s, and with an assumed right atrial pressure of 10 mmHg, the estimated right ventricular systolic pressure is 31.3 mmHg.  Left Atrium: Left atrial size was moderately dilated. No left atrial/left atrial appendage thrombus was detected.  Right Atrium: Right atrial size was normal in size.  Pericardium: There is no evidence of pericardial effusion.  Mitral Valve: No prolapse. Carpentier type 3A restricted posterior leaflet motion Pisa Radius 0.8 , ERO .39 and RV 55 suggest moderate to severe disease. 3D VC 0.87 suggests worse but there appears to be two MR jets one central and one lateral which makes assessing 3D VC difficult There was blunted systolic forward flow in the PV;s but clearly no reversal. The mitral valve is abnormal. Normal mobility of the mitral valve leaflets. Moderate to severe mitral valve regurgitation. No evidence of mitral valve stenosis.  Tricuspid Valve: The tricuspid valve is normal in structure. Tricuspid valve regurgitation is moderate . No evidence of tricuspid stenosis.  Aortic Valve: The aortic valve is tricuspid. Aortic valve regurgitation is not visualized. No aortic stenosis is present.  Pulmonic Valve: The pulmonic valve was normal in structure. Pulmonic valve regurgitation is mild. No evidence of pulmonic stenosis.  Aorta: The aortic root is normal in size and structure.  Venous: The inferior vena cava is normal in size with greater than 50% respiratory variability, suggesting right atrial pressure of 3 mmHg.  IAS/Shunts: No atrial level shunt detected by color flow Doppler.   MR Peak grad:    67.2 mmHg   TRICUSPID VALVE MR Mean grad:    44.0 mmHg   TR Peak grad:   21.3 mmHg MR Vmax:         410.00 cm/s TR Vmax:        231.00 cm/s MR Vmean:        312.0 cm/s MR PISA:         2.65 cm MR PISA Eff ROA: 25 mm MR PISA Radius:  0.65 cm  Charlton Haws MD Electronically signed  by Charlton Haws MD Signature Date/Time: 06/11/2020/10:53:14 AM    Final             EKG: NSR, HR 75-personally reviewed  Recent Labs: 09/22/2022: Hemoglobin 12.4; Platelet Count 143   Recent Lipid Panel    Component Value Date/Time   CHOL 185 04/29/2022 0855   TRIG 69 04/29/2022 0855   HDL 57 04/29/2022 0855   CHOLHDL 3.2 04/29/2022 0855  LDLCALC 115 (H) 04/29/2022 0855     Risk Assessment/Calculations:           Physical Exam:    VS:  BP 112/68   Pulse 75   Ht 5\' 5"  (1.651 m)   Wt 240 lb 9.6 oz (109.1 kg)   SpO2 98%   BMI 40.04 kg/m     Wt Readings from Last 3 Encounters:  04/17/23 240 lb 9.6 oz (109.1 kg)  03/03/23 241 lb (109.3 kg)  11/13/22 243 lb 12.8 oz (110.6 kg)     GEN: Well nourished, well developed in no acute distress HEENT: Normal NECK: No JVD; No carotid bruits CARDIAC: RRR, 2/6 systolic murmur RESPIRATORY:  Clear to auscultation without rales, wheezing or rhonchi  MUSCULOSKELETAL:  No edema; No deformity  SKIN: Warm and dry NEUROLOGIC:  Alert and oriented x 3 PSYCHIATRIC:  Normal affect   ASSESSMENT:    1. Moderate mitral valve regurgitation   2. Mild mitral stenosis   3. Moderate tricuspid regurgitation   4. Essential hypertension   5. Sarcoidosis   6. Hyperlipidemia, unspecified hyperlipidemia type   7. Morbid obesity (HCC)   8. History of pericarditis   9. 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 Normal LVEF 55-60%. Normal RV. Normal PASP. Valve appears either postinflammatory (? Related to sarcoid) vs rheumatic. -Continue serial monitoring with repeat TTEs every year with next 11/2023  #Recurrent Pericarditis: Last episode 01/2021. Resolved with colchicine. No recurrence since that time. -Continue to  monitor -Off colchicine  #Hyperthyroidism s/p RAI: -Follow-up with endocrinology as scheduled  #Pulmonary Sarcoid: -Follows with Dr. Delton Coombes -No known cardiac sarcoid  #HLD: Ca score 0 in [place] 3 years ago. Did not tolerate statins and declined starting cholesterol lowering therapy today. Can repeat Ca score in 2 years for monitoring.  -Ca score 0 about 3 years ago; can repeat testing in 2025 -Willing to trial zetia 10mg  daily -Check lipids in 8 weeks -Lifestyle modifications  #Obesity: BMI 40.5.Working on lifestyle modifications. -Lifestyle modifications as detailed below -Plans to see weight loss clinic  Exercise recommendations: Goal of exercising for at least 30 minutes a day, at least 5 times per week.  Please exercise to a moderate exertion.  This means that while exercising it is difficult to speak in full sentences, however you are not so short of breath that you feel you must stop, and not so comfortable that you can carry on a full conversation.  Exertion level should be approximately a 5/10, if 10 is the most exertion you can perform.  Diet recommendations: Recommend a heart healthy diet such as the Mediterranean diet.  This diet consists of plant based foods, healthy fats, lean meats, olive oil.  It suggests limiting the intake of simple carbohydrates such as white breads, pastries, and pastas.  It also limits the amount of red meat, wine, and dairy products such as cheese that one should consume on a daily basis.          Follow-up: 6 months  Medication Adjustments/Labs and Tests Ordered: Current medicines are reviewed at length with the patient today.  Concerns regarding medicines are outlined above.   Orders Placed This Encounter  Procedures   Lipid Profile   EKG 12-Lead   Meds ordered this encounter  Medications   ezetimibe (ZETIA) 10  MG tablet    Sig: Take 1 tablet (10 mg total) by mouth daily.    Dispense:  90 tablet    Refill:  3   Patient  Instructions  Medication Instructions:   START TAKING ZETIA 10 MG BY MOUTH DAILY  *If you need a refill on your cardiac medications before your next appointment, please call your pharmacy*    Lab Work:  IN 8 WEEKS HERE IN THE OFFICE--LIPIDS-PLEASE COME FASTING TO THIS LAB APPOINTMENT  If you have labs (blood work) drawn today and your tests are completely normal, you will receive your results only by: MyChart Message (if you have MyChart) OR A paper copy in the mail If you have any lab test that is abnormal or we need to change your treatment, we will call you to review the results.    Follow-Up: At [facility], you and your health needs are our priority.  As part of our continuing mission to provide you with exceptional heart care, we have created designated Provider Care Teams.  These Care Teams include your primary Cardiologist (physician) and Advanced Practice Providers (APPs -  Physician Assistants and Nurse Practitioners) who all work together to provide you with the care you need, when you need it.  We recommend signing up for the patient portal called "MyChart".  Sign up information is provided on this After Visit Summary.  MyChart is used to connect with patients for Virtual Visits (Telemedicine).  Patients are able to view lab/test results, encounter notes, upcoming appointments, etc.  Non-urgent messages can be sent to your provider as well.   To learn more about what you can do with MyChart, go to [email or web address].    Your next appointment:   6 month(s)  Provider:   Jodelle Red, MD           Signed, Meriam Sprague, MD  04/17/2023 9:40 AM    [facility]

## 2023-04-17 ENCOUNTER — Encounter: Payer: Self-pay | Admitting: Cardiology

## 2023-04-17 ENCOUNTER — Ambulatory Visit: Payer: Medicare Other | Attending: Cardiology | Admitting: Cardiology

## 2023-04-17 VITALS — BP 112/68 | HR 75 | Ht 65.0 in | Wt 240.6 lb

## 2023-04-17 DIAGNOSIS — I340 Nonrheumatic mitral (valve) insufficiency: Secondary | ICD-10-CM | POA: Diagnosis not present

## 2023-04-17 DIAGNOSIS — I10 Essential (primary) hypertension: Secondary | ICD-10-CM | POA: Diagnosis not present

## 2023-04-17 DIAGNOSIS — D869 Sarcoidosis, unspecified: Secondary | ICD-10-CM | POA: Diagnosis not present

## 2023-04-17 DIAGNOSIS — Z79899 Other long term (current) drug therapy: Secondary | ICD-10-CM | POA: Diagnosis not present

## 2023-04-17 DIAGNOSIS — I071 Rheumatic tricuspid insufficiency: Secondary | ICD-10-CM

## 2023-04-17 DIAGNOSIS — I050 Rheumatic mitral stenosis: Secondary | ICD-10-CM

## 2023-04-17 DIAGNOSIS — E785 Hyperlipidemia, unspecified: Secondary | ICD-10-CM

## 2023-04-17 DIAGNOSIS — Z8679 Personal history of other diseases of the circulatory system: Secondary | ICD-10-CM

## 2023-04-17 MED ORDER — EZETIMIBE 10 MG PO TABS: 10.0000 mg | ORAL_TABLET | Freq: Every day | ORAL | 3 refills | Status: DC

## 2023-04-17 NOTE — Patient Instructions (Signed)
Medication Instructions:   START TAKING ZETIA 10 MG BY MOUTH DAILY  *If you need a refill on your cardiac medications before your next appointment, please call your pharmacy*    Lab Work:  IN 8 WEEKS HERE IN THE OFFICE--LIPIDS-PLEASE COME FASTING TO THIS LAB APPOINTMENT  If you have labs (blood work) drawn today and your tests are completely normal, you will receive your results only by: MyChart Message (if you have MyChart) OR A paper copy in the mail If you have any lab test that is abnormal or we need to change your treatment, we will call you to review the results.    Follow-Up: At [facility], you and your health needs are our priority.  As part of our continuing mission to provide you with exceptional heart care, we have created designated Provider Care Teams.  These Care Teams include your primary Cardiologist (physician) and Advanced Practice Providers (APPs -  Physician Assistants and Nurse Practitioners) who all work together to provide you with the care you need, when you need it.  We recommend signing up for the patient portal called "MyChart".  Sign up information is provided on this After Visit Summary.  MyChart is used to connect with patients for Virtual Visits (Telemedicine).  Patients are able to view lab/test results, encounter notes, upcoming appointments, etc.  Non-urgent messages can be sent to your provider as well.   To learn more about what you can do with MyChart, go to [email or web address].    Your next appointment:   6 month(s)  Provider:   Jodelle Red, MD

## 2023-04-30 DIAGNOSIS — D61818 Other pancytopenia: Secondary | ICD-10-CM | POA: Diagnosis not present

## 2023-04-30 DIAGNOSIS — J309 Allergic rhinitis, unspecified: Secondary | ICD-10-CM | POA: Diagnosis not present

## 2023-04-30 DIAGNOSIS — R531 Weakness: Secondary | ICD-10-CM | POA: Diagnosis not present

## 2023-04-30 DIAGNOSIS — L309 Dermatitis, unspecified: Secondary | ICD-10-CM | POA: Diagnosis not present

## 2023-04-30 DIAGNOSIS — G4709 Other insomnia: Secondary | ICD-10-CM | POA: Diagnosis not present

## 2023-04-30 DIAGNOSIS — E785 Hyperlipidemia, unspecified: Secondary | ICD-10-CM | POA: Diagnosis not present

## 2023-04-30 DIAGNOSIS — R7303 Prediabetes: Secondary | ICD-10-CM | POA: Diagnosis not present

## 2023-04-30 DIAGNOSIS — R400 Somnolence: Secondary | ICD-10-CM | POA: Diagnosis not present

## 2023-05-06 DIAGNOSIS — L905 Scar conditions and fibrosis of skin: Secondary | ICD-10-CM | POA: Diagnosis not present

## 2023-05-06 DIAGNOSIS — L259 Unspecified contact dermatitis, unspecified cause: Secondary | ICD-10-CM | POA: Diagnosis not present

## 2023-05-06 DIAGNOSIS — L309 Dermatitis, unspecified: Secondary | ICD-10-CM | POA: Diagnosis not present

## 2023-05-06 DIAGNOSIS — L280 Lichen simplex chronicus: Secondary | ICD-10-CM | POA: Diagnosis not present

## 2023-05-08 ENCOUNTER — Encounter: Payer: Self-pay | Admitting: Dietician

## 2023-05-08 ENCOUNTER — Ambulatory Visit (HOSPITAL_BASED_OUTPATIENT_CLINIC_OR_DEPARTMENT_OTHER): Payer: Medicare Other | Admitting: Dietician

## 2023-05-08 DIAGNOSIS — R7303 Prediabetes: Secondary | ICD-10-CM | POA: Diagnosis not present

## 2023-05-08 NOTE — Progress Notes (Signed)
Patient was seen on 05/08/2023 for the Diabetes Prevention Program course at [facility]. By the end of this session patients are able to complete the following objectives:   Learning Objectives: List risk factors for heart disease.  Define the difference between HDL and LDL cholesterol List ways to reduce risk for heart disease.   Goals:  Record weight taken outside of class.  Track foods and beverages eaten each day in the "Food and Activity Tracker," including calories and fat grams for each item.   Track activity type, minutes you were active, and distance you reached each day in the "Food and Activity Tracker."   Follow-Up Plan: Attend next session.  Bring completed "Food and Activity Trackers" to next session to be reviewed by Lifestyle Coach.

## 2023-05-18 ENCOUNTER — Telehealth: Payer: Self-pay | Admitting: Emergency Medicine

## 2023-05-18 MED ORDER — PREDNISONE 10 MG PO TABS: 20.0000 mg | ORAL_TABLET | Freq: Every day | ORAL | 0 refills | Status: AC

## 2023-05-18 NOTE — Telephone Encounter (Signed)
Spoke with patient regarding prior message per Tammy    ?Sarcoid / Bronchitis flare -begin Prednisone 20mg  daily for 5 days #5. May use  Delsym 2 tsp Twice daily As needed  cough.  Rinse after Breo.  Cont on Zyrtec and Singulair daily .  If not improving will need ov for further evaluation.  Please contact office for sooner follow up if symptoms do not improve or worsen or seek emergency care   Patient's voice was understanding. Nothing else further needed at this time.

## 2023-05-18 NOTE — Telephone Encounter (Signed)
Pt. Called needing med advise she is having bad cough did offer apt but didn't work with her sched.

## 2023-05-18 NOTE — Telephone Encounter (Signed)
?  Sarcoid / Bronchitis flare -begin Prednisone 20mg  daily for 5 days #5. May use  Delsym 2 tsp Twice daily As needed  cough.  Rinse after Breo.  Cont on Zyrtec and Singulair daily .  If not improving will need ov for further evaluation.  Please contact office for sooner follow up if symptoms do not improve or worsen or seek emergency care

## 2023-05-18 NOTE — Telephone Encounter (Signed)
Spoke with patient regarding message. Patient stated she has been using her inhaler Breo 200 also using her ventolin at least 2-3 times a day. Patient stated by the evening she feel's her throat gets irritated and some SOB . Patient stated she had a older script of prednisone 20mg  that she recently started to take it. Patient was offered a office visit for tomorrow morning but patient is unable to come in . Patient said she can wait until next week if needed.   Tammy can you please advise .

## 2023-05-29 ENCOUNTER — Encounter: Payer: Medicare Other | Attending: Internal Medicine | Admitting: Dietician

## 2023-05-29 ENCOUNTER — Encounter: Payer: Self-pay | Admitting: Dietician

## 2023-05-29 DIAGNOSIS — R7303 Prediabetes: Secondary | ICD-10-CM

## 2023-05-29 NOTE — Progress Notes (Signed)
Patient was seen on 05/29/2023 for a post core session of Diabetes Prevention Program course at [facility]. By the end of this session patients are able to complete the following objectives:   Learning Objectives: Explain how glucose is used in the body and it's relationship with insulin/insulin resistance.  Identify symptoms of diabetes.  Describe lab tests used to diagnose diabetes.  Describe health complications and conditions related to diabetes.   Goals:  Record weight taken outside of class.  Track foods and beverages eaten each day in the "Food and Activity Tracker," including calories and fat grams for each item.   Track activity type, minutes you were active, and distance you reached each day in the "Food and Activity Tracker."   Follow-Up Plan: Attend session 19 in two weeks.  Bring completed "Food and Activity Trackers" to next session to be reviewed by Lifestyle Coach.

## 2023-06-11 ENCOUNTER — Ambulatory Visit: Payer: Medicare Other | Attending: Cardiology

## 2023-06-11 DIAGNOSIS — I10 Essential (primary) hypertension: Secondary | ICD-10-CM | POA: Diagnosis not present

## 2023-06-11 DIAGNOSIS — D869 Sarcoidosis, unspecified: Secondary | ICD-10-CM

## 2023-06-11 DIAGNOSIS — I340 Nonrheumatic mitral (valve) insufficiency: Secondary | ICD-10-CM

## 2023-06-11 DIAGNOSIS — I071 Rheumatic tricuspid insufficiency: Secondary | ICD-10-CM

## 2023-06-11 DIAGNOSIS — Z8679 Personal history of other diseases of the circulatory system: Secondary | ICD-10-CM

## 2023-06-11 DIAGNOSIS — I050 Rheumatic mitral stenosis: Secondary | ICD-10-CM

## 2023-06-11 DIAGNOSIS — Z79899 Other long term (current) drug therapy: Secondary | ICD-10-CM

## 2023-06-11 DIAGNOSIS — E785 Hyperlipidemia, unspecified: Secondary | ICD-10-CM

## 2023-06-12 ENCOUNTER — Telehealth: Payer: Self-pay | Admitting: *Deleted

## 2023-06-12 DIAGNOSIS — Z789 Other specified health status: Secondary | ICD-10-CM

## 2023-06-12 DIAGNOSIS — E785 Hyperlipidemia, unspecified: Secondary | ICD-10-CM

## 2023-06-12 DIAGNOSIS — Z79899 Other long term (current) drug therapy: Secondary | ICD-10-CM

## 2023-06-12 LAB — LIPID PANEL: Triglycerides: 70 mg/dL (ref 0–149)

## 2023-06-12 NOTE — Telephone Encounter (Signed)
The patient has been notified of the result and verbalized understanding.  All questions (if any) were answered.  Pt states she is having issues with tolerating zetia.  She said she is experiencing the same side effects as she had with statins.  Pt states she is very interested in being referred to our lipid clinic to discuss alternative options.  Pt aware that I will place the referral to lipid clinic in the system and send a message to our [facility] Schedulers to call her back to arrange this appt.   Pt verbalized understanding and agrees with this plan.

## 2023-06-12 NOTE — Telephone Encounter (Signed)
-----   Message from Meriam Sprague sent at 06/12/2023  1:02 PM EDT ----- LDL cholesterol increased to 133. Has she been taking her zetia? If so, is she interested in seeing Pharm D HLD clinic to discuss alternative options? Has not tolerated statins in the past

## 2023-06-17 DIAGNOSIS — L239 Allergic contact dermatitis, unspecified cause: Secondary | ICD-10-CM | POA: Diagnosis not present

## 2023-06-18 DIAGNOSIS — R21 Rash and other nonspecific skin eruption: Secondary | ICD-10-CM | POA: Diagnosis not present

## 2023-06-18 DIAGNOSIS — E0500 Thyrotoxicosis with diffuse goiter without thyrotoxic crisis or storm: Secondary | ICD-10-CM | POA: Diagnosis not present

## 2023-06-25 ENCOUNTER — Inpatient Hospital Stay: Payer: Medicare Other | Admitting: Oncology

## 2023-06-25 ENCOUNTER — Inpatient Hospital Stay: Payer: Medicare Other | Attending: Internal Medicine

## 2023-06-25 ENCOUNTER — Telehealth: Payer: Self-pay | Admitting: *Deleted

## 2023-06-25 NOTE — Telephone Encounter (Signed)
Called patient to reschedule her "no show" lab/OV today. She inquired if she really needed to return to Dr. Truett Perna. Per Dr. Truett Perna: She does not need to f/u here if she does not want to. She can have her PCP monitor her leukopenia.  Forwarded note and last office visit to Dr. Hillard Danker (PCP)

## 2023-06-26 ENCOUNTER — Encounter: Payer: Self-pay | Admitting: Dietician

## 2023-06-26 ENCOUNTER — Encounter: Payer: Medicare Other | Attending: Internal Medicine | Admitting: Dietician

## 2023-06-26 DIAGNOSIS — R7303 Prediabetes: Secondary | ICD-10-CM | POA: Insufficient documentation

## 2023-06-26 NOTE — Progress Notes (Signed)
Patient was seen on 06/26/2023 for the Diabetes Prevention Program course at [facility]. By the end of this session patients are able to complete the following objectives:   Learning Objectives: Reflect on lifestyle changes they have made since starting the DPP.  Set long-term goals to promote continued maintenance of lifestyle changes made during the program.   Goals:  Work toward reaching new long-term goals set during class.   Follow-Up Plan: Contact Lifestyle Coach with questions/concerns PRN.

## 2023-07-06 ENCOUNTER — Encounter: Payer: Self-pay | Admitting: Student

## 2023-07-06 ENCOUNTER — Ambulatory Visit: Payer: Medicare Other | Attending: Cardiovascular Disease | Admitting: Student

## 2023-07-06 VITALS — Ht 65.0 in | Wt 241.8 lb

## 2023-07-06 DIAGNOSIS — E7801 Familial hypercholesterolemia: Secondary | ICD-10-CM

## 2023-07-06 DIAGNOSIS — E78019 Familial hypercholesterolemia, unspecified: Secondary | ICD-10-CM | POA: Insufficient documentation

## 2023-07-06 DIAGNOSIS — E785 Hyperlipidemia, unspecified: Secondary | ICD-10-CM | POA: Diagnosis not present

## 2023-07-06 MED ORDER — ATORVASTATIN CALCIUM 10 MG PO TABS: 10.0000 mg | ORAL_TABLET | Freq: Every day | ORAL | 3 refills | Status: DC

## 2023-07-06 NOTE — Progress Notes (Unsigned)
Patient ID: Diana Calhoun                 DOB: [birth year]                    MRN: [id number]      HPI: Diana Calhoun is a 67 y.o. female patient referred to lipid clinic by Dr.Pemberton . PMH is significant for sarcoidosis, MVP with MR, pericardial effusion s/p window, GERD, HLD, hyperthyroidism.  Ca score 0 in [place] 3 years ago, due for repeat test in 2025. Patient reported statin intolerance, Zetia was initiated on 04/17/2023. She could not tolerated due to muscle and joint pain.  Pt presented today for lipid clinic. Reports first statin she tried was 8 years ago but she was not able to tolerate due to myalgia. She had tried lower dose too. 3 years ago  CAC score was 0 so her previous cardiologist had advise to mange HLD with lifestyle intervention. Lately her diet has not been that great that is why her recent LDLc elevated compared to the previous reading (133 05/2023 - a year ago 115 mg/dl)  She thinks she could have tolerated Zetia if she would have given some more time. She has not been doing any exercise. However she has implemented walking schedule and slowly improving her diet. We reviewed options for lowering LDL cholesterol, including statins, ezetimibe, PCSK-9 inhibitors.  Discussed mechanisms of action, dosing, side effects and potential decreases in LDL cholesterol.  Also reviewed cost information and potential options for patient assistance.  Current Medications: none  Intolerances: statins and Zetia - muscle and joint pain  Risk Factors: 10 years ASCVD risk score 8% moderate to high intensity statins recommended but intolerance reported, Xanthelasma palpebrarum  LDL goal: <70 mg/dl  Last Lab: LDLc 563, HDL 65, TG 70, TC 210 (06/11/2023) not on any treatment; LDLc went up - a year ago 115 mg/dl   Diet: recently change the diet low fat and low starch   Exercise: started walking 30- 45 min every day   Family History: no one in the family has heart condition.  Hypertension in  mother.   Social History: alcohol- 1/2 glass wine every 3 months   Labs: Lipid Panel     Component Value Date/Time   CHOL 210 (H) 06/11/2023 0817   TRIG 70 06/11/2023 0817   HDL 65 06/11/2023 0817   CHOLHDL 3.2 06/11/2023 0817   LDLCALC 133 (H) 06/11/2023 0817   LABVLDL 12 06/11/2023 0817    Past Medical History:  Diagnosis Date   Body mass index (BMI) of 30.0-30.9 in adult    Familial hypercholesterolemia    GERD (gastroesophageal reflux disease)    Hyperthyroidism    Mitral valve regurgitation    MVP (mitral valve prolapse)    Pulmonary sarcoidosis (HCC)    Tricuspid regurgitation     Current Outpatient Medications on File Prior to Visit  Medication Sig Dispense Refill   acetaminophen (TYLENOL) 500 MG tablet Take 500 mg by mouth every 6 (six) hours as needed.     albuterol (VENTOLIN HFA) 108 (90 Base) MCG/ACT inhaler Inhale 1-2 puffs into the lungs every 6 (six) hours as needed for wheezing or shortness of breath. 6.7 g 5   benzonatate (TESSALON) 100 MG capsule Take 1 capsule (100 mg total) by mouth every 6 (six) hours as needed for cough. 30 capsule 1   BREO ELLIPTA 200-25 MCG/ACT AEPB INHALE ONE DOSE BY MOUTH INTO LUNGS DAILY 60 each 5  fluticasone (FLONASE) 50 MCG/ACT nasal spray Place 2 sprays into both nostrils in the morning and at bedtime. Try not to use right before bedtime. 16 g 11   HYDROcodone bit-homatropine (HYCODAN) 5-1.5 MG/5ML syrup Take 5 mLs by mouth every 6 (six) hours as needed for cough. 240 mL 0   pantoprazole (PROTONIX) 40 MG tablet Take 1 tablet every day by oral route. 30 tablet 5   tiZANidine (ZANAFLEX) 4 MG tablet Take 4 mg by mouth at bedtime.     Vitamin D, Ergocalciferol, (DRISDOL) 1.25 MG (50000 UNIT) CAPS capsule Take 50,000 Units by mouth once a week.     No current facility-administered medications on file prior to visit.    Allergies  Allergen Reactions   Shellfish Allergy Itching   Sulfa Antibiotics Other (See Comments)    Unknown    Zetia [Ezetimibe] Other (See Comments)    Joint pain and muscle aches    Assessment/Plan:  1. Hyperlipidemia -  Problem  Familial Hypercholesterolemia   Current Medications: none  Intolerances: statins and Zetia - muscle and joint pain  Risk Factors: 10 years ASCVD risk score 8% moderate to high intensity statins recommended but intolerance reported, Xanthelasma palpebrarum  LDL goal: <70 mg/dl  Last Lab: LDLc 161, HDL 65, TG 70, TC 210 (06/11/2023) not on any treatment; LDLc went up - a year ago 115 mg/dl     Familial hypercholesterolemia Assessment:  LDL goal: < 70 mg/dl last LDLc 096 mg/dl (02/5408) Currently not on any medication for Lipid management  Intolerance to statins ( does not recall name of the statin - may be Crestor)  and Zetia-  myalgia  Discussed next potential options (statins, ezetimibe, PCSK-9 inhibitors); cost, dosing efficacy, side effects  Patient is willing to try different statin and Zetia again  Already working on improving diet and implemented regular walking regimen (30-45 min brisk walk every day)   Plan: Start taking Lipitor 10 mg daily, 2 weeks letter can re try Zetia 10 mg daily  Will follow up in month over the phone to check the tolerability  If current options are not well tolerated to insufficient to achieve LDLc goal reasonable to consider PCSK9i  Lipid and LFT labs due in 2-3 months of therapy modification    Thank you,  Carmela Hurt, Pharm.D [facility] A Division of [facility] [address] [place], [place] [address]  Phone: [phone]; Fax: [phone]

## 2023-07-06 NOTE — Assessment & Plan Note (Addendum)
Assessment:  LDL goal: < 70 mg/dl last LDLc 846 mg/dl (96/2952) Currently not on any medication for Lipid management  Intolerance to statins ( does not recall name of the statin - may be Crestor)  and Zetia-  myalgia  Discussed next potential options (statins, ezetimibe, PCSK-9 inhibitors); cost, dosing efficacy, side effects  Patient is willing to try different statin and Zetia again  Already working on improving diet and implemented regular walking regimen (30-45 min brisk walk every day)   Plan: Start taking Lipitor 10 mg daily, 2 weeks letter can re try Zetia 10 mg daily  Will follow up in month over the phone to check the tolerability  If current options are not well tolerated to insufficient to achieve LDLc goal reasonable to consider PCSK9i  Lipid and LFT labs due in 2-3 months of therapy modification

## 2023-07-06 NOTE — Patient Instructions (Signed)
Your Results:             Your most recent labs Goal  Total Cholesterol 210 < 200  Triglycerides 70 < 150  HDL (happy/good cholesterol) 65 > 40  LDL (lousy/bad cholesterol 133 < 100   Medication changes:  start taking Lipitor 10 mg daily and if tolerated well try ezetimibe 10 mg 1/2 tab daily     Lab orders: We want to repeat labs after 2-3 months.  We will send you a lab order to remind you once we get closer to that time.

## 2023-07-09 DIAGNOSIS — J3081 Allergic rhinitis due to animal (cat) (dog) hair and dander: Secondary | ICD-10-CM | POA: Diagnosis not present

## 2023-07-09 DIAGNOSIS — J45991 Cough variant asthma: Secondary | ICD-10-CM | POA: Diagnosis not present

## 2023-07-09 DIAGNOSIS — J301 Allergic rhinitis due to pollen: Secondary | ICD-10-CM | POA: Diagnosis not present

## 2023-07-09 DIAGNOSIS — J3089 Other allergic rhinitis: Secondary | ICD-10-CM | POA: Diagnosis not present

## 2023-07-29 DIAGNOSIS — E0500 Thyrotoxicosis with diffuse goiter without thyrotoxic crisis or storm: Secondary | ICD-10-CM | POA: Diagnosis not present

## 2023-08-03 DIAGNOSIS — H9202 Otalgia, left ear: Secondary | ICD-10-CM | POA: Diagnosis not present

## 2023-08-06 ENCOUNTER — Telehealth: Payer: Self-pay

## 2023-08-06 ENCOUNTER — Other Ambulatory Visit (HOSPITAL_COMMUNITY): Payer: Self-pay

## 2023-08-06 ENCOUNTER — Telehealth: Payer: Self-pay | Admitting: Pharmacist

## 2023-08-06 DIAGNOSIS — E785 Hyperlipidemia, unspecified: Secondary | ICD-10-CM

## 2023-08-06 NOTE — Telephone Encounter (Signed)
Pharmacy Patient Advocate Encounter   Received notification from Physician's Office that prior authorization for NEXLETOL is required/requested.   Insurance verification completed.   The patient is insured through [employer] .   Per test claim: PA required; PA submitted to [facility] via CoverMyMeds Key/confirmation #/EOC [id number] Status is pending

## 2023-08-06 NOTE — Telephone Encounter (Signed)
PA request has been Submitted. New Encounter created for follow up. For additional info see Pharmacy Prior Auth telephone encounter from 08/06/23.

## 2023-08-06 NOTE — Telephone Encounter (Signed)
Pharmacy Patient Advocate Encounter  Received notification from [facility] that Prior Authorization for NEXLETOL has been APPROVED from 08/06/23 to 02/03/24. Ran test claim, Copay is $47. This test claim was processed through [facility]- copay amounts may vary at other pharmacies due to pharmacy/plan contracts, or as the patient moves through the different stages of their insurance plan.

## 2023-08-06 NOTE — Telephone Encounter (Signed)
Call to f/u on statin and Zetia, pt reports she is unable to tolerate Lipitor 10 mg and 5 mg due to myalgia. she has been taking Zetia and so far she does not have any side effects.   She refuse to go on any injectable therapies but willing to try Nexletol.  Will assess coverage and PA requirement for Nexletol.

## 2023-08-07 ENCOUNTER — Ambulatory Visit: Payer: Medicare Other | Attending: Internal Medicine

## 2023-08-07 DIAGNOSIS — E038 Other specified hypothyroidism: Secondary | ICD-10-CM | POA: Diagnosis not present

## 2023-08-07 DIAGNOSIS — R7303 Prediabetes: Secondary | ICD-10-CM | POA: Diagnosis not present

## 2023-08-07 DIAGNOSIS — E785 Hyperlipidemia, unspecified: Secondary | ICD-10-CM

## 2023-08-07 DIAGNOSIS — E0500 Thyrotoxicosis with diffuse goiter without thyrotoxic crisis or storm: Secondary | ICD-10-CM | POA: Diagnosis not present

## 2023-08-07 NOTE — Addendum Note (Signed)
Addended by: Tylene Fantasia on: 08/07/2023 08:58 AM   Modules accepted: Orders

## 2023-08-08 LAB — LIPID PANEL
Chol/HDL Ratio: 3.3 ratio (ref 0.0–4.4)
Cholesterol, Total: 196 mg/dL (ref 100–199)
HDL: 60 mg/dL (ref >39)
LDL Chol Calc (NIH): 122 mg/dL — ABNORMAL HIGH (ref 0–99)
Triglycerides: 75 mg/dL (ref 0–149)
VLDL Cholesterol Cal: 14 mg/dL (ref 5–40)

## 2023-08-10 ENCOUNTER — Ambulatory Visit: Payer: Medicare Other | Attending: Cardiology

## 2023-08-10 DIAGNOSIS — E785 Hyperlipidemia, unspecified: Secondary | ICD-10-CM | POA: Diagnosis not present

## 2023-08-10 LAB — URIC ACID: Uric Acid: 4.6 mg/dL (ref 3.0–7.2)

## 2023-08-10 NOTE — Addendum Note (Signed)
Addended by: Tylene Fantasia on: 08/10/2023 08:35 AM   Modules accepted: Orders

## 2023-08-10 NOTE — Addendum Note (Signed)
Addended by: Tylene Fantasia on: 08/10/2023 09:29 AM   Modules accepted: Orders

## 2023-08-10 NOTE — Addendum Note (Signed)
Addended by: Malena Peer D on: 08/10/2023 08:47 AM   Modules accepted: Orders

## 2023-08-11 MED ORDER — NEXLETOL 180 MG PO TABS: 180.0000 mg | ORAL_TABLET | Freq: Every day | ORAL | 11 refills | Status: DC

## 2023-08-11 NOTE — Addendum Note (Signed)
Addended by: Tylene Fantasia on: 08/11/2023 08:05 AM   Modules accepted: Orders

## 2023-08-11 NOTE — Telephone Encounter (Signed)
Called patient to discuss uric acid lab - it is WNL, will initiate Nexletol 180 mg daily. Patient states she can not afford the medication this month so will start taking it from next month. Once [facility] grant opens we will enroll her in the grant to make medication affordable.

## 2023-08-12 ENCOUNTER — Ambulatory Visit: Payer: Medicare Other | Admitting: Emergency Medicine

## 2023-08-31 DIAGNOSIS — R7303 Prediabetes: Secondary | ICD-10-CM | POA: Diagnosis not present

## 2023-08-31 DIAGNOSIS — M797 Fibromyalgia: Secondary | ICD-10-CM | POA: Diagnosis not present

## 2023-08-31 DIAGNOSIS — D869 Sarcoidosis, unspecified: Secondary | ICD-10-CM | POA: Diagnosis not present

## 2023-08-31 DIAGNOSIS — E559 Vitamin D deficiency, unspecified: Secondary | ICD-10-CM | POA: Diagnosis not present

## 2023-08-31 DIAGNOSIS — E785 Hyperlipidemia, unspecified: Secondary | ICD-10-CM | POA: Diagnosis not present

## 2023-08-31 DIAGNOSIS — Z0000 Encounter for general adult medical examination without abnormal findings: Secondary | ICD-10-CM | POA: Diagnosis not present

## 2023-08-31 DIAGNOSIS — Z79899 Other long term (current) drug therapy: Secondary | ICD-10-CM | POA: Diagnosis not present

## 2023-09-07 DIAGNOSIS — Z1211 Encounter for screening for malignant neoplasm of colon: Secondary | ICD-10-CM | POA: Diagnosis not present

## 2023-09-07 DIAGNOSIS — Z1212 Encounter for screening for malignant neoplasm of rectum: Secondary | ICD-10-CM | POA: Diagnosis not present

## 2023-09-09 DIAGNOSIS — E038 Other specified hypothyroidism: Secondary | ICD-10-CM | POA: Diagnosis not present

## 2023-09-09 DIAGNOSIS — R0683 Snoring: Secondary | ICD-10-CM | POA: Diagnosis not present

## 2023-09-10 ENCOUNTER — Telehealth: Payer: Self-pay | Admitting: Pharmacist

## 2023-09-10 NOTE — Telephone Encounter (Signed)
Spoke to patient to remind follow up uric acid lab - started Nexletol almost a month ago. Patient will go for lab on Monday Oct 28  after her 10:30 appointment.

## 2023-09-14 DIAGNOSIS — E0500 Thyrotoxicosis with diffuse goiter without thyrotoxic crisis or storm: Secondary | ICD-10-CM | POA: Diagnosis not present

## 2023-09-14 DIAGNOSIS — E785 Hyperlipidemia, unspecified: Secondary | ICD-10-CM | POA: Diagnosis not present

## 2023-09-14 DIAGNOSIS — E039 Hypothyroidism, unspecified: Secondary | ICD-10-CM | POA: Diagnosis not present

## 2023-09-14 DIAGNOSIS — R7303 Prediabetes: Secondary | ICD-10-CM | POA: Diagnosis not present

## 2023-09-15 LAB — URIC ACID: Uric Acid: 4.4 mg/dL (ref 3.0–7.2)

## 2023-09-16 ENCOUNTER — Telehealth: Payer: Self-pay | Admitting: Pharmacist

## 2023-09-16 ENCOUNTER — Encounter: Payer: Self-pay | Admitting: Pharmacist

## 2023-09-16 NOTE — Telephone Encounter (Signed)
Called pt regarding [facility] grant, she is still interested in being enrolled to help with the cost of her Nexletol. I will enroll pt and send her info via mychart message, she was appreciative for the assistance.

## 2023-09-23 ENCOUNTER — Ambulatory Visit
Admission: RE | Admit: 2023-09-23 | Discharge: 2023-09-23 | Disposition: A | Discharge status: Home / Self Care | Payer: Medicare Other | Source: Ambulatory Visit | Attending: Emergency Medicine | Admitting: Emergency Medicine

## 2023-09-23 DIAGNOSIS — D869 Sarcoidosis, unspecified: Secondary | ICD-10-CM

## 2023-09-24 DIAGNOSIS — Z1231 Encounter for screening mammogram for malignant neoplasm of breast: Secondary | ICD-10-CM | POA: Diagnosis not present

## 2023-10-04 NOTE — Progress Notes (Signed)
Cardiology Office Note:  .   Date:  10/05/2023  ID:  Diana Calhoun, DOB 04-28-1956, MRN [id number] PCP: Thana Ates, MD  [facility] Providers Cardiologist:  Jodelle Red, MD {  History of Present Illness: .   Diana Calhoun is a 67 y.o. 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 She was previously followed by Dr. Delton See and then Dr. Shari Calhoun.  Pertinent CV history: TTE 11/2022 for monitoring of MR with EF 55-60%, normal RV, normal RVSP, post-inflammatory appearanece of MR with moderate MR, mild MS with average mean gradient 4.84mmHg.   Today: Reviewed her history today. Worried that she may eventually need mitral valve surgery. Has been struggling with her thyroid, managed by Dr. Sharl Calhoun. Has been told her sarcoidosis is under control, treated more for asthma. Use to be able to climb 3 flights of stairs easily in the past, but now notices more shortness of breath with this. No chest heaviness/tightness. Has noticed this more with her fluctuating thyroid levels. No leg swelling or shortness of breath at rest. No PND or orthopnea.  Reviewed her lipid numbers today. No known CAD.  Pending colonoscopy, sleep study, cataract surgery  ROS: Denies chest pain, shortness of breath at rest. No PND, orthopnea, LE edema or unexpected weight gain. No syncope or palpitations. ROS otherwise negative except as noted.   Studies Reviewed: Marland Kitchen    EKG:  EKG Interpretation Date/Time:  Monday October 05 2023 40:98:11 EST Ventricular Rate:  71 PR Interval:  162 QRS Duration:  74 QT Interval:  382 QTC Calculation: 415 R Axis:   62  Text Interpretation: Sinus rhythm with occasional Premature ventricular complexes and Premature atrial complexes Confirmed by Jodelle Red [phone]) on 10/05/2023 10:06:06 AM    Physical Exam:   VS:  BP 120/72 (BP Location: Left Arm, Patient Position: Sitting, Cuff Size: Large)   Pulse 71    Ht 5\' 5"  (1.651 m)   Wt 243 lb 3.2 oz (110.3 kg)   BMI 40.47 kg/m    Wt Readings from Last 3 Encounters:  10/05/23 243 lb 3.2 oz (110.3 kg)  07/06/23 241 lb 12.8 oz (109.7 kg)  04/17/23 240 lb 9.6 oz (109.1 kg)    GEN: Well nourished, well developed in no acute distress HEENT: Normal, moist mucous membranes NECK: No JVD CARDIAC: regular rhythm, normal S1 and S2, no rubs or gallops. 2/6 systolic murmur. VASCULAR: Radial and DP pulses 2+ bilaterally. No carotid bruits RESPIRATORY:  Clear to auscultation without rales, wheezing or rhonchi  ABDOMEN: Soft, non-tender, non-distended MUSCULOSKELETAL:  Ambulates independently SKIN: Warm and dry, no edema NEUROLOGIC:  Alert and oriented x 3. No focal neuro deficits noted. PSYCHIATRIC:  Normal affect    ASSESSMENT AND PLAN: .    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 Resolved with colchicine. No recurrence since that time.   Pulmonary Sarcoid: -Follows with Dr. Delton Coombes -No known cardiac sarcoid   HLD: -Ca score 0 in ~2022 [place] -did not tolerate statins -now on zetia, does have some foot cramps but not severe -recommended for nexletol, tried for a time but cost was very high, on waiting list for grant -Lifestyle modifications   Obesity: BMI 40. Working on lifestyle modifications.  CV risk counseling and prevention -recommend heart healthy/Mediterranean diet, with whole grains, fruits, vegetable, fish, lean meats, nuts, and olive  oil. Limit salt. -recommend moderate walking, 3-5 times/week for 30-50 minutes each session. Aim for at least 150 minutes.week. Goal should be pace of 3 miles/hours, or walking 1.5 miles in 30 minutes -recommend avoidance of tobacco products. Avoid excess alcohol. -ASCVD risk score: The 10-year ASCVD risk score (Arnett DK, et  al., 2019) is: 9.4%   Values used to calculate the score:     Age: 31 years     Sex: Female     Is Non-Hispanic African American: Yes     Diabetic: No     Tobacco smoker: No     Systolic Blood Pressure: 120 mmHg     Is BP treated: No     HDL Cholesterol: 60 mg/dL     Total Cholesterol: 196 mg/dL    Dispo: 6 mos  Signed, Jodelle Red, MD   Jodelle Red, MD, PhD, [facility]  [facility]    Heart & Vascular at [facility] at [facility] [address] [place], [place] [address] [phone]

## 2023-10-05 ENCOUNTER — Encounter (HOSPITAL_BASED_OUTPATIENT_CLINIC_OR_DEPARTMENT_OTHER): Payer: Self-pay | Admitting: Cardiology

## 2023-10-05 ENCOUNTER — Ambulatory Visit (HOSPITAL_BASED_OUTPATIENT_CLINIC_OR_DEPARTMENT_OTHER): Payer: Medicare Other | Admitting: Cardiology

## 2023-10-05 VITALS — BP 120/72 | HR 71 | Ht 65.0 in | Wt 243.2 lb

## 2023-10-05 DIAGNOSIS — D869 Sarcoidosis, unspecified: Secondary | ICD-10-CM | POA: Diagnosis not present

## 2023-10-05 DIAGNOSIS — I340 Nonrheumatic mitral (valve) insufficiency: Secondary | ICD-10-CM | POA: Diagnosis not present

## 2023-10-05 DIAGNOSIS — E785 Hyperlipidemia, unspecified: Secondary | ICD-10-CM

## 2023-10-05 DIAGNOSIS — Z8679 Personal history of other diseases of the circulatory system: Secondary | ICD-10-CM

## 2023-10-05 DIAGNOSIS — I050 Rheumatic mitral stenosis: Secondary | ICD-10-CM

## 2023-10-05 DIAGNOSIS — I10 Essential (primary) hypertension: Secondary | ICD-10-CM

## 2023-10-05 NOTE — Patient Instructions (Signed)

## 2023-10-08 DIAGNOSIS — D869 Sarcoidosis, unspecified: Secondary | ICD-10-CM | POA: Diagnosis not present

## 2023-10-08 DIAGNOSIS — G4734 Idiopathic sleep related nonobstructive alveolar hypoventilation: Secondary | ICD-10-CM | POA: Diagnosis not present

## 2023-10-08 DIAGNOSIS — G4733 Obstructive sleep apnea (adult) (pediatric): Secondary | ICD-10-CM | POA: Diagnosis not present

## 2023-10-14 DIAGNOSIS — E0590 Thyrotoxicosis, unspecified without thyrotoxic crisis or storm: Secondary | ICD-10-CM | POA: Diagnosis not present

## 2023-10-22 DIAGNOSIS — K219 Gastro-esophageal reflux disease without esophagitis: Secondary | ICD-10-CM | POA: Diagnosis not present

## 2023-10-22 DIAGNOSIS — K5900 Constipation, unspecified: Secondary | ICD-10-CM | POA: Diagnosis not present

## 2023-10-22 DIAGNOSIS — R195 Other fecal abnormalities: Secondary | ICD-10-CM | POA: Diagnosis not present

## 2023-11-24 ENCOUNTER — Ambulatory Visit (HOSPITAL_COMMUNITY): Payer: Medicare Other

## 2023-11-26 ENCOUNTER — Ambulatory Visit (HOSPITAL_COMMUNITY): Payer: Medicare Other | Attending: Cardiology

## 2023-11-26 DIAGNOSIS — I341 Nonrheumatic mitral (valve) prolapse: Secondary | ICD-10-CM | POA: Insufficient documentation

## 2023-11-26 DIAGNOSIS — I071 Rheumatic tricuspid insufficiency: Secondary | ICD-10-CM | POA: Diagnosis not present

## 2023-11-26 DIAGNOSIS — I050 Rheumatic mitral stenosis: Secondary | ICD-10-CM | POA: Diagnosis not present

## 2023-11-26 DIAGNOSIS — I059 Rheumatic mitral valve disease, unspecified: Secondary | ICD-10-CM | POA: Diagnosis not present

## 2023-11-26 DIAGNOSIS — I340 Nonrheumatic mitral (valve) insufficiency: Secondary | ICD-10-CM | POA: Diagnosis not present

## 2023-11-26 LAB — ECHOCARDIOGRAM COMPLETE
Area-P 1/2: 2.5 cm2
MV M vel: 5.34 m/s
MV Peak grad: 114.1 mm[Hg]
Radius: 0.55 cm
S' Lateral: 3.11 cm

## 2023-12-02 ENCOUNTER — Encounter: Payer: Self-pay | Admitting: Emergency Medicine

## 2023-12-02 ENCOUNTER — Ambulatory Visit: Payer: Medicare HMO | Admitting: Emergency Medicine

## 2023-12-02 VITALS — BP 128/72 | HR 79 | Temp 97.9°F | Ht 65.0 in | Wt 248.8 lb

## 2023-12-02 DIAGNOSIS — I340 Nonrheumatic mitral (valve) insufficiency: Secondary | ICD-10-CM | POA: Diagnosis not present

## 2023-12-02 DIAGNOSIS — J301 Allergic rhinitis due to pollen: Secondary | ICD-10-CM

## 2023-12-02 DIAGNOSIS — J4489 Other specified chronic obstructive pulmonary disease: Secondary | ICD-10-CM | POA: Diagnosis not present

## 2023-12-02 DIAGNOSIS — D869 Sarcoidosis, unspecified: Secondary | ICD-10-CM

## 2023-12-02 DIAGNOSIS — G4733 Obstructive sleep apnea (adult) (pediatric): Secondary | ICD-10-CM | POA: Diagnosis not present

## 2023-12-02 NOTE — Assessment & Plan Note (Signed)
 This is a new diagnosis, made elsewhere.  She is planning to start CPAP

## 2023-12-02 NOTE — Assessment & Plan Note (Signed)
 Continue same regimen

## 2023-12-02 NOTE — Assessment & Plan Note (Signed)
 She is having multifactorial dyspnea but unclear to me that her obstructive lung disease has progressed.  I would like for her to follow with cardiology regarding her mitral valve, get started on CPAP, continue to work on weight loss.  If we still are dealing with dyspnea we will plan to repeat her pulmonary function testing and possibly change her Breo to an alternative.

## 2023-12-02 NOTE — Patient Instructions (Addendum)
 We reviewed your CT scan of the chest today.  This is stable compared with priors.  Good news. Please continue Breo once daily as you have been taking it.  Rinse and gargle after using. Keep albuterol  available to use 2 puffs up to every 4 hours if needed for shortness of breath, chest tightness, wheezing.  Agree with starting CPAP treatment as planned Follow with cardiology regarding your echocardiogram and your mitral valve function as planned Follow with endocrinology as planned Agree that you would benefit from slow steady weight loss Continue your Zyrtec, Singulair , fluticasone  nasal spray as you have been taking them Follow Dr. Baldwin Levee in 6 months, sooner if you have any problems.

## 2023-12-02 NOTE — Assessment & Plan Note (Signed)
 Recent echocardiogram.  Will need cardiology follow-up

## 2023-12-02 NOTE — Assessment & Plan Note (Signed)
 We reviewed your CT scan of the chest today.  This is stable compared with priors.  Good news.

## 2023-12-02 NOTE — Progress Notes (Signed)
   Subjective:    Patient ID: Diana Calhoun, female    DOB: [birth year], 68 y.o.   MRN: [id number]  HPI   ROV 12/02/2023 --68 year old woman with sarcoidosis with cardiac and cutaneous involvement, mediastinal adenopathy.  She has associated obstructive lung disease/asthma.  I follow her for this as well as for chronic cough in the setting of GERD and chronic rhinitis, has used Tessalon .  She has been managed on Breo, Zyrtec, Singulair , fluticasone  nasal spray, Protonix .  Has been on chlorpheniramine as well in the past.  She has been having decreased energy, more exertional SOB for the last few months, she wonders whether it relates to her thyroid  medications > has caused about 20 lbs wt gain. She has been using her albuterol  more > 2x a day. She had a sleep eval and has been dx w OSA >> id planning to start CPAP    CT chest 09/23/2023 reviewed by me, shows similar mediastinal adenopathy including a 1.9 cm right paratracheal node, 2.0 cm subcarinal node, no real change and hilar adenopathy/fullness..  Unchanged areas of pleural thickening and small nodularity.  There is pulmonary arterial enlargement consistent with possible PAH  Echocardiogram 11/22/23 >> LVEF 60-65%, normal RV size and function; ? Some worsening of her MR, further eval may be needed.    Review of Systems As per HPI      Objective:   Physical Exam  Vitals:   12/02/23 0953  BP: 128/72  Pulse: 79  Temp: 97.9 F (36.6 C)  TempSrc: Oral  SpO2: 98%  Weight: 248 lb 12.8 oz (112.9 kg)  Height: 5\' 5"  (1.651 m)   Gen: Pleasant, overwt woman, in no distress,  normal affect  ENT: No lesions,  mouth clear,  oropharynx clear, no postnasal drip  Neck: No JVD, no stridor  Lungs: No use of accessory muscles, no crackles or wheezing on normal respiration, no wheeze on forced expiration  Cardiovascular: RRR, soft 2/6 systolic murmur  Musculoskeletal: No deformities, no cyanosis or clubbing  Neuro: alert, awake, non  focal  Skin: Warm, no rash     Assessment & Plan:  Sarcoidosis We reviewed your CT scan of the chest today.  This is stable compared with priors.  Good news.  Asthma with COPD (HCC) She is having multifactorial dyspnea but unclear to me that her obstructive lung disease has progressed.  I would like for her to follow with cardiology regarding her mitral valve, get started on CPAP, continue to work on weight loss.  If we still are dealing with dyspnea we will plan to repeat her pulmonary function testing and possibly change her Breo to an alternative.  Mitral regurgitation Recent echocardiogram.  Will need cardiology follow-up  Allergic rhinitis Continue same regimen  Obstructive sleep apnea This is a new diagnosis, made elsewhere.  She is planning to start CPAP   Racheal Buddle, MD, PhD 12/02/2023, 10:24 AM [facility] [phone] or if no answer [phone]

## 2023-12-07 ENCOUNTER — Encounter (HOSPITAL_BASED_OUTPATIENT_CLINIC_OR_DEPARTMENT_OTHER): Payer: Self-pay

## 2023-12-09 DIAGNOSIS — E039 Hypothyroidism, unspecified: Secondary | ICD-10-CM | POA: Diagnosis not present

## 2023-12-14 ENCOUNTER — Encounter (HOSPITAL_BASED_OUTPATIENT_CLINIC_OR_DEPARTMENT_OTHER): Payer: Self-pay | Admitting: Cardiology

## 2023-12-14 ENCOUNTER — Ambulatory Visit (HOSPITAL_BASED_OUTPATIENT_CLINIC_OR_DEPARTMENT_OTHER): Payer: Medicare HMO | Admitting: Cardiology

## 2023-12-14 VITALS — BP 122/72 | HR 46 | Ht 65.0 in | Wt 248.0 lb

## 2023-12-14 DIAGNOSIS — Z789 Other specified health status: Secondary | ICD-10-CM

## 2023-12-14 DIAGNOSIS — I051 Rheumatic mitral insufficiency: Secondary | ICD-10-CM | POA: Diagnosis not present

## 2023-12-14 DIAGNOSIS — E039 Hypothyroidism, unspecified: Secondary | ICD-10-CM | POA: Diagnosis not present

## 2023-12-14 DIAGNOSIS — I10 Essential (primary) hypertension: Secondary | ICD-10-CM

## 2023-12-14 DIAGNOSIS — D869 Sarcoidosis, unspecified: Secondary | ICD-10-CM | POA: Diagnosis not present

## 2023-12-14 DIAGNOSIS — E7800 Pure hypercholesterolemia, unspecified: Secondary | ICD-10-CM

## 2023-12-14 DIAGNOSIS — E0500 Thyrotoxicosis with diffuse goiter without thyrotoxic crisis or storm: Secondary | ICD-10-CM | POA: Diagnosis not present

## 2023-12-14 DIAGNOSIS — Z8679 Personal history of other diseases of the circulatory system: Secondary | ICD-10-CM | POA: Diagnosis not present

## 2023-12-14 DIAGNOSIS — I340 Nonrheumatic mitral (valve) insufficiency: Secondary | ICD-10-CM | POA: Diagnosis not present

## 2023-12-14 NOTE — Progress Notes (Signed)
Cardiology Office Note:  .   Date:  12/14/2023  ID:  Diana Calhoun, DOB 1956-11-11, MRN [id number] PCP: Thana Ates, MD  [facility] Providers Cardiologist:  Jodelle Red, MD {  History of Present Illness: .   Diana Calhoun is a 68 y.o. 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 She was previously followed by Dr. Delton See and then Dr. Shari Prows.  Pertinent CV history: TTE 11/2022 for monitoring of MR with EF 55-60%, normal RV, normal RVSP, post-inflammatory appearanece of MR with moderate MR, mild MS with average mean gradient 4.36mmHg.   Today: Very nervous today about her echo, discussed today. Moderate to severe MR, RVSP 37 mmHg, severely dilated LA, RAP 3 mmHg. We reviewed her images together today. Discussed repeat TEE today (had a year ago), timing, see below.  She feels that her breathing is slowly improving. Wants to work on weight loss  Saw her endocrinologist this AM. She noted that her symptoms worsened on the higher dose synthroid. She had her dose decreased this AM. She notes that she is already less short of breath than she was before.  ROS: Denies chest pain, shortness of breath at rest. No PND, orthopnea, LE edema or unexpected weight gain. No syncope or palpitations. ROS otherwise negative except as noted.   Studies Reviewed: Marland Kitchen    EKG:       Physical Exam:   VS:  BP 122/72   Pulse (!) 46   Ht 5\' 5"  (1.651 m)   Wt 248 lb (112.5 kg)   SpO2 95%   BMI 41.27 kg/m    Wt Readings from Last 3 Encounters:  12/14/23 248 lb (112.5 kg)  12/02/23 248 lb 12.8 oz (112.9 kg)  10/05/23 243 lb 3.2 oz (110.3 kg)    GEN: Well nourished, well developed in no acute distress HEENT: Normal, moist mucous membranes NECK: No JVD CARDIAC: regular rhythm, normal S1 and S2, no rubs or gallops. 2/6 systolic murmur. VASCULAR: Radial and DP pulses 2+ bilaterally. No carotid bruits RESPIRATORY:  Clear to  auscultation without rales, wheezing or rhonchi  ABDOMEN: Soft, non-tender, non-distended MUSCULOSKELETAL:  Ambulates independently SKIN: Warm and dry, no edema NEUROLOGIC:  Alert and oriented x 3. No focal neuro deficits noted. PSYCHIATRIC:  Normal affect    ASSESSMENT AND PLAN: .    Shortness of breath -she feels this is improving -see below. 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 Discussed MV disease at length, including evaluation prior (TEE, R/LHC), difference between repair, tissue replacement, mechanical replacement -see above. She feels her shortness of breath is stable to improving. No swelling. No afib (though discussed risk with MR) -discussed timing of the above workup and referral. Did extensive shared decision making; based on this, will monitor her symptoms and reassess in 6 mos. 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 Resolved with colchicine. No recurrence since that time.   Pulmonary Sarcoid: -Follows with Dr. Delton Coombes -No known cardiac sarcoid   HLD: -Ca score 0 in ~2022 [place] -did not tolerate statins -now on zetia, does have some foot cramps but not severe -recommended for nexletol, tried for a time but cost was very  high, on waiting list for grant -Lifestyle modifications   Obesity: BMI 41. Working on lifestyle modifications.  Sleep apnea: pending start of CPAP  CV risk counseling and prevention -recommend heart healthy/Mediterranean diet, with whole grains, fruits, vegetable, fish, lean meats, nuts, and olive oil. Limit salt. -recommend moderate walking, 3-5 times/week  for 30-50 minutes each session. Aim for at least 150 minutes.week. Goal should be pace of 3 miles/hours, or walking 1.5 miles in 30 minutes -recommend avoidance of tobacco products. Avoid excess alcohol. -ASCVD risk score: The 10-year ASCVD risk score (Arnett DK, et al., 2019) is: 9.7%   Values used to calculate the score:     Age: 67 years     Sex: Female     Is Non-Hispanic African American: Yes     Diabetic: No     Tobacco smoker: No     Systolic Blood Pressure: 122 mmHg     Is BP treated: No     HDL Cholesterol: 60 mg/dL     Total Cholesterol: 196 mg/dL    Dispo: 6 mos or sooner if symptoms worsen  Total time of encounter: I spent 40 minutes dedicated to the care of this patient on the date of this encounter to include pre-visit review of records, face-to-face time with the patient discussing conditions above, and clinical documentation with the electronic health record. We specifically spent time today discussing mitral regurgitation, review of her images and symptoms, options for monitoring vs evaluation at this time, discussion of signs that need medical attention.   Signed, Jodelle Red, MD   Jodelle Red, MD, PhD, [facility]  [facility]  [place]  [facility] at [facility] [address] [place], [place] [address] [phone]

## 2023-12-14 NOTE — Patient Instructions (Signed)
Medication Instructions:  Your physician recommends that you continue on your current medications as directed. Please refer to the Current Medication list given to you today.  *If you need a refill on your cardiac medications before your next appointment, please call your pharmacy*  Follow-Up: At [facility], you and your health needs are our priority.  As part of our continuing mission to provide you with exceptional heart care, we have created designated Provider Care Teams.  These Care Teams include your primary Cardiologist (physician) and Advanced Practice Providers (APPs -  Physician Assistants and Nurse Practitioners) who all work together to provide you with the care you need, when you need it.  We recommend signing up for the patient portal called "MyChart".  Sign up information is provided on this After Visit Summary.  MyChart is used to connect with patients for Virtual Visits (Telemedicine).  Patients are able to view lab/test results, encounter notes, upcoming appointments, etc.  Non-urgent messages can be sent to your provider as well.   To learn more about what you can do with MyChart, go to [email or web address].    Your next appointment:   6 month(s)  The format for your next appointment:   In Person  Provider:   Jodelle Red, MD

## 2024-01-04 DIAGNOSIS — I340 Nonrheumatic mitral (valve) insufficiency: Secondary | ICD-10-CM | POA: Diagnosis not present

## 2024-01-04 DIAGNOSIS — E785 Hyperlipidemia, unspecified: Secondary | ICD-10-CM | POA: Diagnosis not present

## 2024-01-04 DIAGNOSIS — K5909 Other constipation: Secondary | ICD-10-CM | POA: Diagnosis not present

## 2024-01-04 DIAGNOSIS — G4733 Obstructive sleep apnea (adult) (pediatric): Secondary | ICD-10-CM | POA: Diagnosis not present

## 2024-01-04 DIAGNOSIS — R7303 Prediabetes: Secondary | ICD-10-CM | POA: Diagnosis not present

## 2024-01-04 DIAGNOSIS — E039 Hypothyroidism, unspecified: Secondary | ICD-10-CM | POA: Diagnosis not present

## 2024-01-06 ENCOUNTER — Other Ambulatory Visit (HOSPITAL_COMMUNITY): Payer: Self-pay

## 2024-01-06 ENCOUNTER — Telehealth: Payer: Self-pay

## 2024-01-06 NOTE — Telephone Encounter (Signed)
Pharmacy Patient Advocate Encounter   Received notification from CoverMyMeds that prior authorization for NEXLETOL is required/requested.   Insurance verification completed.   The patient is insured through [employer] .   Per test claim: The current 30 day co-pay is, $103.26.  No PA needed at this time. This test claim was processed through [facility]- copay amounts may vary at other pharmacies due to pharmacy/plan contracts, or as the patient moves through the different stages of their insurance plan.

## 2024-01-27 DIAGNOSIS — G4733 Obstructive sleep apnea (adult) (pediatric): Secondary | ICD-10-CM | POA: Diagnosis not present

## 2024-01-27 DIAGNOSIS — E785 Hyperlipidemia, unspecified: Secondary | ICD-10-CM | POA: Diagnosis not present

## 2024-01-27 DIAGNOSIS — Z7951 Long term (current) use of inhaled steroids: Secondary | ICD-10-CM | POA: Diagnosis not present

## 2024-01-27 DIAGNOSIS — K5900 Constipation, unspecified: Secondary | ICD-10-CM | POA: Diagnosis not present

## 2024-01-27 DIAGNOSIS — H04123 Dry eye syndrome of bilateral lacrimal glands: Secondary | ICD-10-CM | POA: Diagnosis not present

## 2024-01-27 DIAGNOSIS — Z809 Family history of malignant neoplasm, unspecified: Secondary | ICD-10-CM | POA: Diagnosis not present

## 2024-01-27 DIAGNOSIS — H2513 Age-related nuclear cataract, bilateral: Secondary | ICD-10-CM | POA: Diagnosis not present

## 2024-01-27 DIAGNOSIS — H5213 Myopia, bilateral: Secondary | ICD-10-CM | POA: Diagnosis not present

## 2024-01-27 DIAGNOSIS — Z5986 Financial insecurity: Secondary | ICD-10-CM | POA: Diagnosis not present

## 2024-01-27 DIAGNOSIS — M1990 Unspecified osteoarthritis, unspecified site: Secondary | ICD-10-CM | POA: Diagnosis not present

## 2024-01-27 DIAGNOSIS — K219 Gastro-esophageal reflux disease without esophagitis: Secondary | ICD-10-CM | POA: Diagnosis not present

## 2024-01-27 DIAGNOSIS — H524 Presbyopia: Secondary | ICD-10-CM | POA: Diagnosis not present

## 2024-01-27 DIAGNOSIS — D860 Sarcoidosis of lung: Secondary | ICD-10-CM | POA: Diagnosis not present

## 2024-01-27 DIAGNOSIS — H25013 Cortical age-related cataract, bilateral: Secondary | ICD-10-CM | POA: Diagnosis not present

## 2024-01-27 DIAGNOSIS — H52203 Unspecified astigmatism, bilateral: Secondary | ICD-10-CM | POA: Diagnosis not present

## 2024-01-27 DIAGNOSIS — Z8249 Family history of ischemic heart disease and other diseases of the circulatory system: Secondary | ICD-10-CM | POA: Diagnosis not present

## 2024-01-27 DIAGNOSIS — H43813 Vitreous degeneration, bilateral: Secondary | ICD-10-CM | POA: Diagnosis not present

## 2024-01-27 DIAGNOSIS — J45909 Unspecified asthma, uncomplicated: Secondary | ICD-10-CM | POA: Diagnosis not present

## 2024-01-27 DIAGNOSIS — Z5982 Transportation insecurity: Secondary | ICD-10-CM | POA: Diagnosis not present

## 2024-01-27 DIAGNOSIS — H35412 Lattice degeneration of retina, left eye: Secondary | ICD-10-CM | POA: Diagnosis not present

## 2024-02-10 DIAGNOSIS — E039 Hypothyroidism, unspecified: Secondary | ICD-10-CM | POA: Diagnosis not present

## 2024-02-10 DIAGNOSIS — E0500 Thyrotoxicosis with diffuse goiter without thyrotoxic crisis or storm: Secondary | ICD-10-CM | POA: Diagnosis not present

## 2024-02-10 DIAGNOSIS — I340 Nonrheumatic mitral (valve) insufficiency: Secondary | ICD-10-CM | POA: Diagnosis not present

## 2024-02-29 DIAGNOSIS — R195 Other fecal abnormalities: Secondary | ICD-10-CM | POA: Diagnosis not present

## 2024-02-29 DIAGNOSIS — D123 Benign neoplasm of transverse colon: Secondary | ICD-10-CM | POA: Diagnosis not present

## 2024-03-02 DIAGNOSIS — D123 Benign neoplasm of transverse colon: Secondary | ICD-10-CM | POA: Diagnosis not present

## 2024-03-11 DIAGNOSIS — E039 Hypothyroidism, unspecified: Secondary | ICD-10-CM | POA: Diagnosis not present

## 2024-04-13 DIAGNOSIS — R7303 Prediabetes: Secondary | ICD-10-CM | POA: Diagnosis not present

## 2024-04-13 DIAGNOSIS — Z6841 Body Mass Index (BMI) 40.0 and over, adult: Secondary | ICD-10-CM | POA: Diagnosis not present

## 2024-04-13 DIAGNOSIS — R002 Palpitations: Secondary | ICD-10-CM | POA: Diagnosis not present

## 2024-04-13 DIAGNOSIS — J449 Chronic obstructive pulmonary disease, unspecified: Secondary | ICD-10-CM | POA: Diagnosis not present

## 2024-04-13 DIAGNOSIS — J4540 Moderate persistent asthma, uncomplicated: Secondary | ICD-10-CM | POA: Diagnosis not present

## 2024-04-13 DIAGNOSIS — E032 Hypothyroidism due to medicaments and other exogenous substances: Secondary | ICD-10-CM | POA: Diagnosis not present

## 2024-04-13 DIAGNOSIS — Z008 Encounter for other general examination: Secondary | ICD-10-CM | POA: Diagnosis not present

## 2024-04-13 DIAGNOSIS — F320 Major depressive disorder, single episode, mild: Secondary | ICD-10-CM | POA: Diagnosis not present

## 2024-05-05 ENCOUNTER — Telehealth: Payer: Self-pay

## 2024-05-05 NOTE — Telephone Encounter (Signed)
 Copied from CRM [id number]. Topic: Clinical - Prescription Issue >> May 05, 2024  8:47 AM Isabell A wrote: Reason for CRM: Patient states she has a grant for her BREO ELLIPTA  200-25 MCG/ACT AEPB  inhaler but the pharmacy states it's not covered because it has the physician assistants ID  - it needs the physicians ID number.    [facility] [id number] [place], [place] - [address] [place], [place] [address] Phone: [phone]  Fax: [phone]   [facility] X[facility]. LMTCB

## 2024-05-05 NOTE — Telephone Encounter (Signed)
 Duplicate

## 2024-05-12 DIAGNOSIS — E039 Hypothyroidism, unspecified: Secondary | ICD-10-CM | POA: Diagnosis not present

## 2024-05-16 DIAGNOSIS — R7303 Prediabetes: Secondary | ICD-10-CM | POA: Diagnosis not present

## 2024-05-16 DIAGNOSIS — E0500 Thyrotoxicosis with diffuse goiter without thyrotoxic crisis or storm: Secondary | ICD-10-CM | POA: Diagnosis not present

## 2024-05-16 DIAGNOSIS — R208 Other disturbances of skin sensation: Secondary | ICD-10-CM | POA: Diagnosis not present

## 2024-05-16 DIAGNOSIS — K5900 Constipation, unspecified: Secondary | ICD-10-CM | POA: Diagnosis not present

## 2024-05-16 DIAGNOSIS — I340 Nonrheumatic mitral (valve) insufficiency: Secondary | ICD-10-CM | POA: Diagnosis not present

## 2024-05-16 DIAGNOSIS — G4733 Obstructive sleep apnea (adult) (pediatric): Secondary | ICD-10-CM | POA: Diagnosis not present

## 2024-05-16 DIAGNOSIS — F488 Other specified nonpsychotic mental disorders: Secondary | ICD-10-CM | POA: Diagnosis not present

## 2024-05-16 DIAGNOSIS — Z8639 Personal history of other endocrine, nutritional and metabolic disease: Secondary | ICD-10-CM | POA: Diagnosis not present

## 2024-05-16 DIAGNOSIS — E039 Hypothyroidism, unspecified: Secondary | ICD-10-CM | POA: Diagnosis not present

## 2024-05-27 DIAGNOSIS — M25512 Pain in left shoulder: Secondary | ICD-10-CM | POA: Diagnosis not present

## 2024-05-27 DIAGNOSIS — R5383 Other fatigue: Secondary | ICD-10-CM | POA: Diagnosis not present

## 2024-06-10 DIAGNOSIS — M25512 Pain in left shoulder: Secondary | ICD-10-CM | POA: Diagnosis not present

## 2024-06-10 DIAGNOSIS — G8929 Other chronic pain: Secondary | ICD-10-CM | POA: Diagnosis not present

## 2024-06-27 ENCOUNTER — Encounter (HOSPITAL_BASED_OUTPATIENT_CLINIC_OR_DEPARTMENT_OTHER): Payer: Self-pay | Admitting: Family

## 2024-06-27 ENCOUNTER — Ambulatory Visit (HOSPITAL_BASED_OUTPATIENT_CLINIC_OR_DEPARTMENT_OTHER): Admitting: Family

## 2024-06-27 VITALS — BP 132/80 | HR 75 | Ht 65.0 in | Wt 245.3 lb

## 2024-06-27 DIAGNOSIS — M25512 Pain in left shoulder: Secondary | ICD-10-CM | POA: Diagnosis not present

## 2024-06-27 DIAGNOSIS — D869 Sarcoidosis, unspecified: Secondary | ICD-10-CM

## 2024-06-27 DIAGNOSIS — K219 Gastro-esophageal reflux disease without esophagitis: Secondary | ICD-10-CM | POA: Diagnosis not present

## 2024-06-27 DIAGNOSIS — E782 Mixed hyperlipidemia: Secondary | ICD-10-CM

## 2024-06-27 DIAGNOSIS — I051 Rheumatic mitral insufficiency: Secondary | ICD-10-CM | POA: Diagnosis not present

## 2024-06-27 DIAGNOSIS — G4733 Obstructive sleep apnea (adult) (pediatric): Secondary | ICD-10-CM

## 2024-06-27 NOTE — Patient Instructions (Addendum)
 Medication Instructions:  No medication changes were made during today's visit.    Try Pepcid (Famotidine) twice per day for 3-5 days then as needed for acid reflux. You can purchase this at your local drug store.   Referral placed to [facility]: Dr. Genelle   *If you need a refill on your cardiac medications before your next appointment, please call your pharmacy*   Lab Work: No labs were ordered during today's visit.  If you have labs (blood work) drawn today and your tests are completely normal, you will receive your results only by: MyChart Message (if you have MyChart) OR A paper copy in the mail If you have any lab test that is abnormal or we need to change your treatment, we will call you to review the results.   Testing/Procedures: No procedures were ordered during today's visit.    Follow-Up: At [facility], you and your health needs are our priority.  As part of our continuing mission to provide you with exceptional heart care, we have created designated Provider Care Teams.  These Care Teams include your primary Cardiologist (physician) and Advanced Practice Providers (APPs -  Physician Assistants and Nurse Practitioners) who all work together to provide you with the care you need, when you need it.  We recommend signing up for the patient portal called MyChart.  Sign up information is provided on this After Visit Summary.  MyChart is used to connect with patients for Virtual Visits (Telemedicine).  Patients are able to view lab/test results, encounter notes, upcoming appointments, etc.  Non-urgent messages can be sent to your provider as well.   To learn more about what you can do with MyChart, go to [email or web address].    Your next appointment:  Keep scheduled appointment    Provider:   Shelda Bruckner, MD    Other Instructions Thank you for choosing [facility]!      To prevent palpitations: Make sure you are  adequately hydrated.  Avoid and/or limit caffeine containing beverages like soda or tea. Exercise regularly.  Manage stress well. Some over the counter medications can cause palpitations such as Benadryl, AdvilPM, TylenolPM. Regular Advil or Tylenol do not cause palpitations.

## 2024-06-27 NOTE — Progress Notes (Signed)
 Cardiology Office Note   Date:  06/27/2024  ID:  [name], DOB 1956/11/10, MRN [id number] PCP: Dwight Trula SQUIBB, MD  [facility] Providers Cardiologist:  Shelda Bruckner, MD     History of Present Illness Diana Calhoun is a 68 y.o. 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  Previously followed by Dr. Maranda and Dr. Hobart now established with Dr. Bruckner.  Last episode of pericarditis 01/2021 which resolved with colchicine .  Prior TTE 11/2022 with LVEF 55 to 60%, normal RV, normal RVSP, postinflammatory appearance of MR with moderate MR, mild MS with mean gradient 4.6 mmHg.  Echo 11/2023 moderate to severe MR, RVSP 37 mmHg, severely dilated LA, RAP 3 mmHg.    At last visit 12/14/2023 she was motivated to work on weight loss and felt her breathing was overall improved.  She was recommended for monitoring of symptoms and follow-up in 6 months to reassess for any symptoms related MR.  She presents today for follow up independently. Notes she was having some palpitations recently but they have been decreasing. They are most noticeable when laying on her left side. She limits to one cup of coffee in the morning. She does note she might be dehydrated. Notes difficulty with weight loss. She is uninterested in GLP1 at this time as reports concerns about side effects.  Endocrinology had previously prescribed metformin but did not tolerate as felt it worsened constipation and her breathing. Recently started vitamin D supplement prescription which has improved energy level.  Only mild lower extremity edema with recent travel (13 hour train ride) but not otherwise. She notes some left sided shoulder pain when laying on her left side.  Notes injection in the shoulder 1 month ago but no significant improvement in pain.  This pain occurs when laying on her left side but not with exertion.  No chest  pain, pressure, tightness.  She does note some difficulty with GERD despite adherence to her pantoprazole .  She recently got new dentures and wonders if she is not chewing her food as well.  Discussed addition of OTC Pepcid.  ROS: Please see the history of present illness.    All other systems reviewed and are negative.   Studies Reviewed      Cardiac Studies & Procedures   ______________________________________________________________________________________________     ECHOCARDIOGRAM  ECHOCARDIOGRAM COMPLETE 11/26/2023  Narrative ECHOCARDIOGRAM REPORT    Patient Name:   Diana Calhoun Date of Exam: 11/26/2023 Medical Rec #:  [id number]     Height:       65.0 in Accession #:    [id number]    Weight:       243.2 lb Date of Birth:  Sep 13, 1956    BSA:          2.150 m Patient Age:    67 years      BP:           120/72 mmHg Patient Gender: F             HR:           80 bpm. Exam Location:  [address]  Procedure: 2D Echo, Cardiac Doppler, Color Doppler, 3D Echo and Strain Analysis  Indications:    Moderate mitral regurgitation [720003] mild mitral stenosis and mild-moderate TR  History:        Patient has prior history of Echocardiogram examinations, most recent 12/10/2022. Mitral Valve Prolapse and Mitral Valve Disease. Pulmonary Sarcoidosis.  Sonographer:    Lauraine Pilot RDCS Referring Phys: [id number] POWELL FORBES SORROW   Sonographer Comments: Global longitudinal strain was attempted. IMPRESSIONS   1. Left ventricular ejection fraction, by estimation, is 60 to 65%. Left ventricular ejection fraction by 3D volume is 60 %. The left ventricle has normal function. The left ventricle has no regional wall motion abnormalities. Left ventricular diastolic parameters are indeterminate. Elevated left ventricular end-diastolic pressure. The average left ventricular global longitudinal strain is -17.6 %. The global longitudinal strain is normal. 2. Right ventricular systolic  function is normal. The right ventricular size is normal. There is mildly elevated pulmonary artery systolic pressure. The estimated right ventricular systolic pressure is 37.3 mmHg. 3. Left atrial size was severely dilated. 4. The aortic valve is normal in structure. Aortic valve regurgitation is not visualized. No aortic stenosis is present. 5. The inferior vena cava is normal in size with greater than 50% respiratory variability, suggesting right atrial pressure of 3 mmHg. 6. There is right bowing of the interatrial septum, suggestive of elevated left atrial pressure. No atrial level shunt detected by color flow Doppler. 7. 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 The anterior mitral valve is thickened and appears myxomatous with late systolic MVP of the A2 segment with a bright mobile target that either represents a redundant chordae tendinea or a ruptured chord. There is at least moderate mitral regurgitation by PISA ERO of 0.22cm2 and RV of 38cc that likely is underestimated due to eccentrically directed jet towards the posterior LA wall that hugs the wall. The mean MV gradient is increased at likely due to increased flow across the valve from significant MR. Moderate to severe mitral valve regurgitation. Recommend TEE for further evaulation as study worrisome for severe MR.  FINDINGS Left Ventricle: Left ventricular ejection fraction, by estimation, is 60 to 65%. Left ventricular ejection fraction by 3D volume is 60 %. The left ventricle has normal function. The left ventricle has no regional wall motion abnormalities. The average left ventricular global longitudinal strain is -17.6 %. The global longitudinal strain is normal. The left ventricular internal cavity size was normal in size. There is no left ventricular hypertrophy. Left ventricular diastolic parameters are indeterminate. Elevated left ventricular end-diastolic pressure.  Right  Ventricle: The right ventricular size is normal. No increase in right ventricular wall thickness. Right ventricular systolic function is normal. There is mildly elevated pulmonary artery systolic pressure. The tricuspid regurgitant velocity is 2.93 m/s, and with an assumed right atrial pressure of 3 mmHg, the estimated right ventricular systolic pressure is 37.3 mmHg.  Left Atrium: Left atrial size was severely dilated.  Right Atrium: Right atrial size was normal in size.  Pericardium: There is no evidence of pericardial effusion.  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 The anterior mitral valve is thickened and appears myxomatous with late systolic MVP of the A2 segment with a bright mobile target that either represents a redundant chordae tendinea or a ruptured chord. There is at least moderate mitral regurgitation by PISA ERO of 0.22cm2 and RV of 38cc but likely underestimated as the jet is eccentricly directed towards the posterior LA wall and hugs the wall. The mean MV gradient is increased at likely due to increased flow across the valve from significant MR. Recommend TEE for further evaulation as study worrisome for severe MR. The mitral valve is myxomatous. There is moderate thickening of the mitral valve leaflet(s).  There is mild calcification of the mitral valve leaflet(s). Moderate to severe mitral valve regurgitation, with eccentric posteriorly directed jet. No evidence of mitral valve stenosis. MV peak gradient, 13.7 mmHg. The mean mitral valve gradient is 6.0 mmHg.  Tricuspid Valve: The tricuspid valve is normal in structure. Tricuspid valve regurgitation is mild . No evidence of tricuspid stenosis.  Aortic Valve: The aortic valve is normal in structure. Aortic valve regurgitation is not visualized. No aortic stenosis is present.  Pulmonic Valve: The pulmonic valve was normal in structure. Pulmonic valve regurgitation is  mild. No evidence of pulmonic stenosis.  Aorta: The aortic root is normal in size and structure.  Venous: The inferior vena cava is normal in size with greater than 50% respiratory variability, suggesting right atrial pressure of 3 mmHg.  IAS/Shunts: There is right bowing of the interatrial septum, suggestive of elevated left atrial pressure. No atrial level shunt detected by color flow Doppler.   LEFT VENTRICLE PLAX 2D LVIDd:         5.04 cm         Diastology LVIDs:         3.11 cm         LV e' medial:    10.80 cm/s LV PW:         0.85 cm         LV E/e' medial:  16.2 LV IVS:        0.90 cm         LV e' lateral:   13.50 cm/s LVOT diam:     2.00 cm         LV E/e' lateral: 13.0 LV SV:         60 LV SV Index:   28              2D LVOT Area:     3.14 cm        Longitudinal Strain 2D Strain GLS  -17.5 % (A2C): 2D Strain GLS  -18.2 % (A3C): 2D Strain GLS  -17.1 % (A4C): 2D Strain GLS  -17.6 % Avg:  3D Volume EF LV 3D EF:    Left ventricul ar ejection fraction by 3D volume is 60 %.  3D Volume EF: 3D EF:        60 % LV EDV:       169 ml LV ESV:       68 ml LV SV:        101 ml  RIGHT VENTRICLE RV S prime:     13.20 cm/s TAPSE (M-mode): 2.4 cm  LEFT ATRIUM              Index        RIGHT ATRIUM           Index LA diam:        5.10 cm  2.37 cm/m   RA Area:     20.40 cm LA Vol (A2C):   108.0 ml 50.24 ml/m  RA Volume:   55.50 ml  25.82 ml/m LA Vol (A4C):   103.0 ml 47.92 ml/m LA Biplane Vol: 107.0 ml 49.78 ml/m AORTIC VALVE LVOT Vmax:   99.50 cm/s LVOT Vmean:  65.100 cm/s LVOT VTI:    0.191 m  AORTA Ao Root diam: 3.00 cm Ao Asc diam:  3.00 cm  MITRAL VALVE                 TRICUSPID VALVE MV Area (PHT): 2.50 cm  TR Peak grad:   34.3 mmHg MV Peak grad:  13.7 mmHg     TR Vmax:        293.00 cm/s MV Mean grad:  6.0 mmHg MV Vmax:       1.85 m/s      SHUNTS MV Vmean:      120.0 cm/s    Systemic VTI:  0.19 m MR Peak grad:   114.1 mmHg   Systemic  Diam: 2.00 cm MR Mean grad:   80.0 mmHg MR Vmax:        534.00 cm/s MR Vmean:       424.0 cm/s MR PISA:        1.90 cm MR PISA Radius: 0.55 cm MV E velocity: 175.00 cm/s MV A velocity: 134.00 cm/s MV E/A ratio:  1.31  Wilbert Bihari MD Electronically signed by Wilbert Bihari MD Signature Date/Time: 11/26/2023/2:42:52 PM    Final   TEE  ECHO TEE 06/11/2020  Narrative TRANSESOPHOGEAL ECHO REPORT    Patient Name:   TINZLEE CRAKER Date of Exam: 06/11/2020 Medical Rec #:  [id number]     Height:       65.0 in Accession #:    [id number]    Weight:       237.0 lb Date of Birth:  03-22-1956    BSA:          2.126 m Patient Age:    63 years      BP:           128/72 mmHg Patient Gender: F             HR:           92 bpm. Exam Location:  Inpatient  Procedure: 3D Echo, Transesophageal Echo, Cardiac Doppler and Color Doppler  Indications:     Mitral Regurgitation  History:         Patient has prior history of Echocardiogram examinations, most recent 05/31/2020. Risk Factors:Dyslipidemia and Non-Smoker. MR. GERD.  Sonographer:     Recardo Hedge RDCS Referring Phys:  [id number] OLIVIA HERO [facility] Diagnosing Phys: Maude Emmer MD  PROCEDURE: The transesophogeal probe was passed without difficulty through the esophogus of the patient. Local oropharyngeal anesthetic was provided with Cetacaine . Sedation performed by different physician. The patient was monitored while under deep sedation. Anesthestetic sedation was provided intravenously by Anesthesiology: 144.06mg  of Propofol , 60mg  of Lidocaine . The patient's vital signs; including heart rate, blood pressure, and oxygen saturation; remained stable throughout the procedure. The patient developed no complications during the procedure.  IMPRESSIONS   1. Left ventricular ejection fraction, by estimation, is 60 to 65%. The left ventricle has normal function. The left ventricle has no regional wall motion abnormalities. 2. Right ventricular systolic  function is normal. The right ventricular size is normal. There is normal pulmonary artery systolic pressure. 3. Left atrial size was moderately dilated. No left atrial/left atrial appendage thrombus was detected. 4. No prolapse. Carpentier type 3A restricted posterior leaflet motion Pisa Radius 0.8 , ERO .39 and RV 55 suggest moderate to severe disease. 3D VC 0.87 suggests worse but there appears to be two MR jets one central and one lateral which makes assessing 3D VC difficult There was blunted systolic forward flow in the PV;s but clearly no reversal . The mitral valve is abnormal. Moderate to severe mitral valve regurgitation. No evidence of mitral stenosis. 5. Tricuspid valve regurgitation is moderate. 6. The aortic valve is tricuspid. Aortic valve regurgitation is not visualized. No aortic stenosis  is present. 7. The inferior vena cava is normal in size with greater than 50% respiratory variability, suggesting right atrial pressure of 3 mmHg.  Conclusion(s)/Recommendation(s): Normal biventricular function without evidence of hemodynamically significant valvular heart disease.  FINDINGS Left Ventricle: Left ventricular ejection fraction, by estimation, is 60 to 65%. The left ventricle has normal function. The left ventricle has no regional wall motion abnormalities. The left ventricular internal cavity size was normal in size. There is no left ventricular hypertrophy.  Right Ventricle: The right ventricular size is normal. No increase in right ventricular wall thickness. Right ventricular systolic function is normal. There is normal pulmonary artery systolic pressure. The tricuspid regurgitant velocity is 2.31 m/s, and with an assumed right atrial pressure of 10 mmHg, the estimated right ventricular systolic pressure is 31.3 mmHg.  Left Atrium: Left atrial size was moderately dilated. No left atrial/left atrial appendage thrombus was detected.  Right Atrium: Right atrial size was normal in  size.  Pericardium: There is no evidence of pericardial effusion.  Mitral Valve: No prolapse. Carpentier type 3A restricted posterior leaflet motion Pisa Radius 0.8 , ERO .39 and RV 55 suggest moderate to severe disease. 3D VC 0.87 suggests worse but there appears to be two MR jets one central and one lateral which makes assessing 3D VC difficult There was blunted systolic forward flow in the PV;s but clearly no reversal. The mitral valve is abnormal. Normal mobility of the mitral valve leaflets. Moderate to severe mitral valve regurgitation. No evidence of mitral valve stenosis.  Tricuspid Valve: The tricuspid valve is normal in structure. Tricuspid valve regurgitation is moderate . No evidence of tricuspid stenosis.  Aortic Valve: The aortic valve is tricuspid. Aortic valve regurgitation is not visualized. No aortic stenosis is present.  Pulmonic Valve: The pulmonic valve was normal in structure. Pulmonic valve regurgitation is mild. No evidence of pulmonic stenosis.  Aorta: The aortic root is normal in size and structure.  Venous: The inferior vena cava is normal in size with greater than 50% respiratory variability, suggesting right atrial pressure of 3 mmHg.  IAS/Shunts: No atrial level shunt detected by color flow Doppler.   MR Peak grad:    67.2 mmHg   TRICUSPID VALVE MR Mean grad:    44.0 mmHg   TR Peak grad:   21.3 mmHg MR Vmax:         410.00 cm/s TR Vmax:        231.00 cm/s MR Vmean:        312.0 cm/s MR PISA:         2.65 cm MR PISA Eff ROA: 25 mm MR PISA Radius:  0.65 cm  Maude Emmer MD Electronically signed by Maude Emmer MD Signature Date/Time: 06/11/2020/10:53:14 AM    Final        ______________________________________________________________________________________________      Risk Assessment/Calculations           Physical Exam VS:  BP 132/80   Pulse 75   Ht 5' 5 (1.651 m)   Wt 245 lb 4.8 oz (111.3 kg)   SpO2 98%   BMI 40.82 kg/m         Wt Readings from Last 3 Encounters:  06/27/24 245 lb 4.8 oz (111.3 kg)  12/14/23 248 lb (112.5 kg)  12/02/23 248 lb 12.8 oz (112.9 kg)    GEN: Well nourished, overweight, well developed in no acute distress NECK: No JVD; No carotid bruits CARDIAC: RRR, gr 2/6 systolic murmur, no rubs, gallops RESPIRATORY:  Clear  to auscultation without rales, wheezing or rhonchi  ABDOMEN: Soft, non-tender, non-distended EXTREMITIES:  No edema; No deformity   ASSESSMENT AND PLAN  Rheumatic/postinflammatory mitral regurgitation - Euvolemic and well compensated on exam. Her exertional dyspnea is overall stable. Mild palpitations when laying on L side which have improved recently, defer monitor as symptoms improved.  Symptoms overall stable will defer referral for mitral valve evaluation. If increased dyspnea, palpitations plan for TEE and R/LHC. She would prefer to delay surgery as long as possible and proceed with least invasive option possible.  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  Pulmonary sarcoidosis - follows with Dr. Shelah. No known cardiac involvement.  OSA - CPAP compliance encouraged.   L shoulder pain - Worse with laying. Minimal improvement with injection with sports med about 1 month ago. Requests referral to orthopedics, referral placed.  GERD -recommend Pepcid 20 mg twice daily for 3 to 5 days then as needed for breakthrough GERD despite adherence to pantoprazole .       Dispo: follow up in 2 months with Dr. Lonni as scheduled per her preference  Signed, Reche GORMAN Finder, NP

## 2024-07-12 ENCOUNTER — Other Ambulatory Visit: Payer: Self-pay | Admitting: Emergency Medicine

## 2024-07-12 NOTE — Telephone Encounter (Signed)
 Copied from CRM #[id number]. Topic: Clinical - Medication Refill >> Jul 12, 2024  8:43 AM [name] wrote: Medication: albuterol  (VENTOLIN  HFA) 108 (90 Base) MCG/ACT inhaler and  BREO ELLIPTA  200-25 MCG/ACT AEPB   Has the patient contacted their pharmacy? Yes (Agent: If no, request that the patient contact the pharmacy for the refill. If patient does not wish to contact the pharmacy document the reason why and proceed with request.) (Agent: If yes, when and what did the pharmacy advise?)  This is the patient's preferred pharmacy:  [facility] [id number] - [place], [place] - [address] [place] [address] Phone: [phone] Fax: [phone]  Is this the correct pharmacy for this prescription? Yes If no, delete pharmacy and type the correct one.   Has the prescription been filled recently? No  Is the patient out of the medication? No  Has the patient been seen for an appointment in the last year OR does the patient have an upcoming appointment? Yes  Can we respond through MyChart? No  Agent: Please be advised that Rx refills may take up to 3 business days. We ask that you follow-up with your pharmacy.

## 2024-07-14 MED ORDER — FLUTICASONE FUROATE-VILANTEROL 200-25 MCG/ACT IN AEPB: 1.0000 | INHALATION_SPRAY | Freq: Every day | RESPIRATORY_TRACT | 5 refills | Status: DC

## 2024-07-14 MED ORDER — ALBUTEROL SULFATE HFA 108 (90 BASE) MCG/ACT IN AERS: 1.0000 | INHALATION_SPRAY | Freq: Four times a day (QID) | RESPIRATORY_TRACT | 5 refills | Status: DC | PRN

## 2024-08-18 ENCOUNTER — Ambulatory Visit (HOSPITAL_BASED_OUTPATIENT_CLINIC_OR_DEPARTMENT_OTHER): Admitting: Cardiology

## 2024-08-18 ENCOUNTER — Encounter (HOSPITAL_BASED_OUTPATIENT_CLINIC_OR_DEPARTMENT_OTHER): Payer: Self-pay | Admitting: Cardiology

## 2024-08-18 VITALS — BP 118/68 | HR 78 | Resp 16 | Ht 65.0 in | Wt 244.0 lb

## 2024-08-18 DIAGNOSIS — Z789 Other specified health status: Secondary | ICD-10-CM | POA: Diagnosis not present

## 2024-08-18 DIAGNOSIS — Z8679 Personal history of other diseases of the circulatory system: Secondary | ICD-10-CM

## 2024-08-18 DIAGNOSIS — D869 Sarcoidosis, unspecified: Secondary | ICD-10-CM | POA: Diagnosis not present

## 2024-08-18 DIAGNOSIS — E7800 Pure hypercholesterolemia, unspecified: Secondary | ICD-10-CM

## 2024-08-18 DIAGNOSIS — G4733 Obstructive sleep apnea (adult) (pediatric): Secondary | ICD-10-CM | POA: Diagnosis not present

## 2024-08-18 DIAGNOSIS — I051 Rheumatic mitral insufficiency: Secondary | ICD-10-CM

## 2024-08-18 NOTE — Progress Notes (Signed)
 Cardiology Office Note:  .   Date:  08/18/2024  ID:  Diana Calhoun, DOB [birth year], MRN [id number] PCP: Dwight Trula SQUIBB, MD  [facility] Providers Cardiologist:  Shelda Bruckner, MD {  History of Present Illness: .   Diana Calhoun is a 68 y.o. 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 She was previously followed by Dr. Maranda and then Dr. Hobart.  Pertinent CV history: TTE 11/2022 for monitoring of MR with EF 55-60%, normal RV, normal RVSP, post-inflammatory appearanece of MR with moderate MR, mild MS with average mean gradient 4.48mmHg. Echo 11/26/2023: Moderate to severe MR, RVSP 37 mmHg, severely dilated LA, RAP 3 mmHg.   Today: No new heart concerns. She can walk outside without any chest tightness/shortness of breath, goes to the store and can be out for hours, etc. Does get somewhat winded climbing stairs.   Started on metformin by her endocrinologist, has terrible nausea/GI symptoms. Also felt more short of breath. We discussed GLPs today. Her concern is constipation, which we discussed. Her last A1c 5.9. We discussed weight loss, lifestyle at length, how GLPs are involved.   Diagnosed with sleep apnea, could not afford machine on her plan  ROS: Denies chest pain, shortness of breath at rest. No PND, orthopnea, LE edema or unexpected weight gain. No syncope or palpitations. ROS otherwise negative except as noted.   Studies Reviewed: SABRA    EKG:       Physical Exam:   VS:  BP 118/68 (BP Location: Left Arm, Patient Position: Sitting, Cuff Size: Large)   Pulse 78   Resp 16   Ht 5' 5 (1.651 m)   Wt 244 lb (110.7 kg)   SpO2 98%   BMI 40.60 kg/m    Wt Readings from Last 3 Encounters:  08/18/24 244 lb (110.7 kg)  06/27/24 245 lb 4.8 oz (111.3 kg)  12/14/23 248 lb (112.5 kg)    GEN: Well nourished, well developed in no acute distress HEENT: Normal, moist mucous membranes NECK: No JVD CARDIAC: regular  rhythm, normal S1 and S2, no rubs or gallops. 2/6 systolic murmur. VASCULAR: Radial and DP pulses 2+ bilaterally. No carotid bruits RESPIRATORY:  Clear to auscultation without rales, wheezing or rhonchi  ABDOMEN: Soft, non-tender, non-distended MUSCULOSKELETAL:  Ambulates independently SKIN: Warm and dry, no edema NEUROLOGIC:  Alert and oriented x 3. No focal neuro deficits noted. PSYCHIATRIC:  Normal affect    ASSESSMENT AND PLAN: .    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 She feels her shortness of breath is stable to improving. No swelling. No afib (though discussed risk with MR) -discussed timing of the above workup and referral. Did extensive shared decision making; based on this, will monitor her symptoms and reassess in 6 mos. 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 Resolved with colchicine . No recurrence since that time.   Pulmonary Sarcoid: -Follows with Dr. Shelah -No known cardiac sarcoid   HLD: -Ca score 0 in ~2022 [place] -did not tolerate statins -now on zetia , does have some foot cramps but not severe -recommended for nexletol , tried for a time but cost was very high -Lifestyle modifications   Obesity Comorbidities: sleep apnea, abnormal glucose handling -I believe she would be a good candidate for GLP. We discussed at length today. She will discuss with her endocrinologist -did not tolerate metformin -  Patient has a BMI of over 30 or over 27 with a weight related comorbidity -Current BMI 40.6 -Starting weight -Weight related comorbidities -Has attempted weight loss with diet/exercise changes. Has tried metformin and did not tolerate due to side effects. Was on fen-phen remotely. Has not been able to lose >5% of weight on their own -Patient is medically appropriate for GLP  treatment for weight loss -discussed potential side effects, including GI discomfort (nausea, constipation, diarrhea). Discussed data re: pancreatitis. She does not have a history of this. No known history of multiple endocrine neoplasia.  Some nausea is normal, as is decreased appetite. Instructed on small meals.  Sleep apnea: not on CPAP  CV risk counseling and prevention -recommend heart healthy/Mediterranean diet, with whole grains, fruits, vegetable, fish, lean meats, nuts, and olive oil. Limit salt. -recommend moderate walking, 3-5 times/week for 30-50 minutes each session. Aim for at least 150 minutes.week. Goal should be pace of 3 miles/hours, or walking 1.5 miles in 30 minutes -recommend avoidance of tobacco products. Avoid excess alcohol. -ASCVD risk score: The 10-year ASCVD risk score (Arnett DK, et al., 2019) is: 9.1%   Values used to calculate the score:     Age: 56 years     Clincally relevant sex: Female     Is Non-Hispanic African American: Yes     Diabetic: No     Tobacco smoker: No     Systolic Blood Pressure: 118 mmHg     Is BP treated: No     HDL Cholesterol: 60 mg/dL     Total Cholesterol: 196 mg/dL    Dispo: 6 mos or sooner if symptoms worsen  Total time of encounter: I spent 45 minutes dedicated to the care of this patient on the date of this encounter to include pre-visit review of records, face-to-face time with the patient discussing conditions above, and clinical documentation with the electronic health record. We specifically spent time today discussing obesity, sleep apnea, risk factors for CV disease, GLP and how they work/data on them, side effects   Signed, Shelda Bruckner, MD   Shelda Bruckner, MD, PhD, [facility]  [facility]  [facility] at [facility] at [facility] [address], [address] [place], [place] [address] [phone]

## 2024-08-18 NOTE — Patient Instructions (Signed)
 Medication Instructions:   Your physician recommends that you continue on your current medications as directed. Please refer to the Current Medication list given to you today.   *If you need a refill on your cardiac medications before your next appointment, please call your pharmacy*  Lab Work:  None ordered.  If you have labs (blood work) drawn today and your tests are completely normal, you will receive your results only by: MyChart Message (if you have MyChart) OR A paper copy in the mail If you have any lab test that is abnormal or we need to change your treatment, we will call you to review the results.  Testing/Procedures:  None ordered.  Follow-Up: At [facility], you and your health needs are our priority.  As part of our continuing mission to provide you with exceptional heart care, our providers are all part of one team.  This team includes your primary Cardiologist (physician) and Advanced Practice Providers or APPs (Physician Assistants and Nurse Practitioners) who all work together to provide you with the care you need, when you need it.  Your next appointment:   6 month(s)  Provider:   Sheryle Donning, MD    We recommend signing up for the patient portal called "MyChart".  Sign up information is provided on this After Visit Summary.  MyChart is used to connect with patients for Virtual Visits (Telemedicine).  Patients are able to view lab/test results, encounter notes, upcoming appointments, etc.  Non-urgent messages can be sent to your provider as well.   To learn more about what you can do with MyChart, go to [email or web address].   Other Instructions  Your physician wants you to follow-up in: 6 months.  You will receive a reminder letter in the mail two months in advance. If you don't receive a letter, please call our office to schedule the follow-up appointment.

## 2024-08-31 ENCOUNTER — Ambulatory Visit: Admitting: Primary Care

## 2024-09-02 ENCOUNTER — Encounter: Payer: Self-pay | Admitting: Emergency Medicine

## 2024-09-02 ENCOUNTER — Ambulatory Visit: Admitting: Emergency Medicine

## 2024-09-02 VITALS — BP 112/64 | HR 77 | Temp 97.7°F | Ht 65.0 in | Wt 246.8 lb

## 2024-09-02 DIAGNOSIS — I051 Rheumatic mitral insufficiency: Secondary | ICD-10-CM

## 2024-09-02 DIAGNOSIS — J4489 Other specified chronic obstructive pulmonary disease: Secondary | ICD-10-CM

## 2024-09-02 DIAGNOSIS — Z227 Latent tuberculosis: Secondary | ICD-10-CM | POA: Insufficient documentation

## 2024-09-02 DIAGNOSIS — D869 Sarcoidosis, unspecified: Secondary | ICD-10-CM

## 2024-09-02 DIAGNOSIS — G4733 Obstructive sleep apnea (adult) (pediatric): Secondary | ICD-10-CM

## 2024-09-02 MED ORDER — FLUTICASONE FUROATE-VILANTEROL 200-25 MCG/ACT IN AEPB: 1.0000 | INHALATION_SPRAY | Freq: Every day | RESPIRATORY_TRACT | 5 refills | Status: AC

## 2024-09-02 MED ORDER — ALBUTEROL SULFATE HFA 108 (90 BASE) MCG/ACT IN AERS: 1.0000 | INHALATION_SPRAY | Freq: Four times a day (QID) | RESPIRATORY_TRACT | 5 refills | Status: AC | PRN

## 2024-09-02 MED ORDER — PANTOPRAZOLE SODIUM 40 MG PO TBEC: DELAYED_RELEASE_TABLET | ORAL | 5 refills | Status: AC

## 2024-09-02 NOTE — Assessment & Plan Note (Signed)
 Treated in the past

## 2024-09-02 NOTE — Patient Instructions (Addendum)
 We will perform an EKG today. Keep up the good work with your exercise routine If your breathing worsens despite your work on exercise and diet then we will discuss with Dr. Lonni and decide whether any testing to evaluate for sarcoid inflammation in the heart muscle would be helpful. Please continue your Breo once daily.  Rinse and gargle after using. Keep your albuterol  available to use 2 puffs when needed for shortness of breath, chest tightness, wheezing.  You might want to consider taking 2 puffs about 10 minutes prior to your exercise to see if this is beneficial. We will plan to repeat your CT scan of the chest in November 2025. Agree that you would benefit from getting on CPAP.  Agree with seeking financial support to allow you to get your machine.  If you need help or documentation from our office please let us  know Follow Dr. Shelah in about 6-8 weeks so we can review your CT scan of the chest

## 2024-09-02 NOTE — Assessment & Plan Note (Signed)
 Her albuterol  use has increased as she is try to increase her exercise routine.  I still think Ranell is a good medication for her and she likes the medication.  We could consider adding LAMA at some point going forward.  Still possible that her dyspnea is multifactorial since her OSA is untreated, she has underlying thyroid  disease, she has gained weight, etc.

## 2024-09-02 NOTE — Progress Notes (Signed)
 Subjective:    Patient ID: Diana Calhoun, female    DOB: 11/01/1956, 68 y.o.   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  She has associated obstructive lung disease/asthma.  I follow her for this as well as for chronic cough in the setting of GERD and chronic rhinitis, has used Tessalon .  She has been managed on Breo, Zyrtec, Singulair , fluticasone  nasal spray, Protonix .  Has been on chlorpheniramine as well in the past.  She has been having decreased energy, more exertional SOB for the last few months, she wonders whether it relates to her thyroid  medications > has caused about 20 lbs wt gain. She has been using her albuterol  more > 2x a day. She had a sleep eval and has been dx w OSA >> id planning to start CPAP    CT chest 09/23/2023 reviewed by me, shows similar mediastinal adenopathy including a 1.9 cm right paratracheal node, 2.0 cm subcarinal node, no real change and hilar adenopathy/fullness..  Unchanged areas of pleural thickening and small nodularity.  There is pulmonary arterial enlargement consistent with possible PAH  Echocardiogram 11/22/23 >> LVEF 60-65%, normal RV size and function; ? Some worsening of her MR, further eval may be needed.   ROV 09/02/2024 --follow-up visit for 68 year old woman with sarcoidosis.  Her disease includes mediastinal adenopathy, cutaneous involvement, pericardial involvement (remote window) and obstructive lung disease/asthma. She was never found to have myocardial disease.  She also has a history of obesity, treated latent TB, allergic rhinitis with chronic cough, mitral regurgitation and obstructive sleep apnea that was diagnosed elsewhere.  She was going to start CPAP but she never did due to cost of the device [employer] did not cover well). She is going to see if she can get financial support to receive. She has been managed on  Breo.  She has seen Dr. Lonni with cardiology.  Today she reports that she has tried to be more active, do more walking. She is using her Breo in the am, but her albuterol  use has increased some - seems to correlate with her exercise, but she is using about 2x a day. She has gained about 20 lbs since 2021. She has been on Advair in the past remotely.   ECG today  >> NSR   Review of Systems As per HPI      Objective:   Physical Exam  Vitals:   09/02/24 1058  BP: 112/64  Pulse: 77  Temp: 97.7 F (36.5 C)  SpO2: 97%  Weight: 246 lb 12.8 oz (111.9 kg)  Height: 5' 5 (1.651 m)   Gen: Pleasant, overwt woman, in no distress,  normal affect  ENT: No lesions,  mouth clear,  oropharynx clear, no postnasal drip  Neck: No JVD, no stridor  Lungs: No use of accessory muscles, no crackles or wheezing on normal respiration, no wheeze on forced expiration  Cardiovascular: Irregular, ? PVC's, soft 2/6 late systolic versus diastolic murmur  Musculoskeletal: No deformities, no cyanosis or clubbing  Neuro: alert, awake, non focal  Skin: Warm, no rash     Assessment & Plan:  Mitral regurgitation Her rhythm is irregular today on exam, was regular when she saw Dr. Lonni recently.  May reflect PVCs but she is at risk for A-fib given her mitral valve disease.  I will check an ECG now to evaluate further.  If there is any evidence for  A-fib then will notify Dr. Lonni  Addendum >> her ECG shows NSR  Obstructive sleep apnea Not yet treated because the device is cost prohibitive.  She is going to work with support group that provides CPAP at low cost.  I will support her in this especially if she needs documentation  Asthma with COPD (HCC) Her albuterol  use has increased as she is try to increase her exercise routine.  I still think Ranell is a good medication for her and she likes the medication.  We could consider adding LAMA at some point going forward.  Still possible that  her dyspnea is multifactorial since her OSA is untreated, she has underlying thyroid  disease, she has gained weight, etc.  Sarcoidosis With resultant asthma, history of adenopathy and prior pulmonary parenchymal disease.  She is off prednisone  (was on it for extended time remotely).  She also had pericardial disease and a window although no documentation of myocardial disease that I can find.  Depending on the course and how her breathing does as we address other potential causes of dyspnea it may be reasonable to consider cardiac PET imaging.  Will discuss with Dr. Lonni as we go forward depending on how her breathing does and the other workup.  TB lung, latent Treated in the past  Time spent 44 minutes reviewing data, symptoms, medications, planning next steps in evaluation  Lamar Chris, MD, PhD 09/02/2024, 11:40 AM [facility] [phone] or if no answer [phone]

## 2024-09-02 NOTE — Assessment & Plan Note (Signed)
 Not yet treated because the device is cost prohibitive.  She is going to work with support group that provides CPAP at low cost.  I will support her in this especially if she needs documentation

## 2024-09-02 NOTE — Assessment & Plan Note (Addendum)
 With resultant asthma, history of adenopathy and prior pulmonary parenchymal disease.  She is off prednisone  (was on it for extended time remotely).  She also had pericardial disease and a window although no documentation of myocardial disease that I can find.  Depending on the course and how her breathing does as we address other potential causes of dyspnea it may be reasonable to consider cardiac PET imaging.  Will discuss with Dr. Lonni as we go forward depending on how her breathing does and the other workup.

## 2024-09-02 NOTE — Addendum Note (Signed)
 Addended byBETHA FRIES, Jadelynn Boylan A on: 09/02/2024 12:47 PM   Modules accepted: Orders

## 2024-09-02 NOTE — Assessment & Plan Note (Addendum)
 Her rhythm is irregular today on exam, was regular when she saw Dr. Lonni recently.  May reflect PVCs but she is at risk for A-fib given her mitral valve disease.  I will check an ECG now to evaluate further.  If there is any evidence for A-fib then will notify Dr. Lonni  Addendum >> her ECG shows NSR

## 2024-09-07 DIAGNOSIS — I340 Nonrheumatic mitral (valve) insufficiency: Secondary | ICD-10-CM | POA: Diagnosis not present

## 2024-09-07 DIAGNOSIS — D869 Sarcoidosis, unspecified: Secondary | ICD-10-CM | POA: Diagnosis not present

## 2024-09-07 DIAGNOSIS — Z79899 Other long term (current) drug therapy: Secondary | ICD-10-CM | POA: Diagnosis not present

## 2024-09-07 DIAGNOSIS — Z0000 Encounter for general adult medical examination without abnormal findings: Secondary | ICD-10-CM | POA: Diagnosis not present

## 2024-09-07 DIAGNOSIS — R7303 Prediabetes: Secondary | ICD-10-CM | POA: Diagnosis not present

## 2024-09-07 DIAGNOSIS — F321 Major depressive disorder, single episode, moderate: Secondary | ICD-10-CM | POA: Diagnosis not present

## 2024-09-07 DIAGNOSIS — E559 Vitamin D deficiency, unspecified: Secondary | ICD-10-CM | POA: Diagnosis not present

## 2024-09-07 DIAGNOSIS — E039 Hypothyroidism, unspecified: Secondary | ICD-10-CM | POA: Diagnosis not present

## 2024-09-07 DIAGNOSIS — E785 Hyperlipidemia, unspecified: Secondary | ICD-10-CM | POA: Diagnosis not present

## 2024-09-07 DIAGNOSIS — D7589 Other specified diseases of blood and blood-forming organs: Secondary | ICD-10-CM | POA: Diagnosis not present

## 2024-09-07 DIAGNOSIS — G4733 Obstructive sleep apnea (adult) (pediatric): Secondary | ICD-10-CM | POA: Diagnosis not present

## 2024-09-27 ENCOUNTER — Other Ambulatory Visit

## 2024-09-27 ENCOUNTER — Ambulatory Visit: Payer: Self-pay | Admitting: Emergency Medicine

## 2024-09-27 ENCOUNTER — Ambulatory Visit
Admission: RE | Admit: 2024-09-27 | Discharge: 2024-09-27 | Disposition: A | Discharge status: Home / Self Care | Source: Ambulatory Visit | Attending: Emergency Medicine | Admitting: Emergency Medicine

## 2024-09-27 ENCOUNTER — Telehealth: Payer: Self-pay | Admitting: Internal Medicine

## 2024-09-27 DIAGNOSIS — D869 Sarcoidosis, unspecified: Secondary | ICD-10-CM

## 2024-09-27 MED ORDER — IOPAMIDOL (ISOVUE-300) INJECTION 61%
80.0000 mL | Freq: Once | INTRAVENOUS | Status: AC | PRN
  Administered 2024-09-27: 80 mL via INTRAVENOUS

## 2024-09-27 NOTE — Telephone Encounter (Signed)
 ATC was unable to leave a voice message

## 2024-09-27 NOTE — Telephone Encounter (Signed)
 Copied from CRM [id number]. Topic: Clinical - [facility] Triage >> Sep 27, 2024  1:50 PM Nathanel DEL wrote: Diana Calhoun triggered transfer to Nurse Triage. See Triage Message for details.  pt states she seems to be using inhalers more to breath.  Pt had her CT today.  She is hoping Dr Shelah can prescribe 2 weeks of prednisone , if he feels  like this is [employer].  She is thinking she may have the beginnings of a breathing problem or infection, and the prednisone  would clear it right up.  Please advise

## 2024-09-27 NOTE — Telephone Encounter (Signed)
 FYI Only or Action Required?: Action required by provider: request for appointment and clinical question for provider.  Patient is followed in Pulmonology for OSA, Asthma, last seen on 09/02/2024 by Shelah Lamar RAMAN, MD.  Called Nurse Triage reporting Shortness of Breath.  Symptoms began several days ago.  Interventions attempted: Rescue inhaler.  Symptoms are: gradually worsening.  Triage Disposition: See PCP Within 2 Weeks  Patient/caregiver understands and will follow disposition?: Yes Reason for Disposition . [1] MILD longstanding difficulty breathing (e.g., minimal/no SOB at rest, SOB with walking, pulse < 100) AND [2] SAME as normal  Answer Assessment - Initial Assessment Questions Requesting Prednisone  for treatment potentially, as the patient currently does not have transportation.   Please contact patient at [phone] as soon as possible, per patient request.  Reviewed red flag symptoms including: - Difficulty breathing - High or persistent fever - Mental status changes - Inability to hydrate - Worsening or unrelieved pain Patient/caregiver instructed to seek immediate care if any of the above develop.  Provided return precautions and escalation instructions appropriate to the concern.    1. RESPIRATORY STATUS: Describe your breathing? (e.g., wheezing, shortness of breath, unable to speak, severe coughing)      Dyspnea on Exertion  2. ONSET: When did this breathing problem begin?      2 Weeks  3. PATTERN Does the difficult breathing come and go, or has it been constant since it started?      Intermittent  4. SEVERITY: How bad is your breathing? (e.g., mild, moderate, severe)      Mild  5. RECURRENT SYMPTOM: Have you had difficulty breathing before? If Yes, ask: When was the last time? and What happened that time?      Yes, Asthma  6. CARDIAC HISTORY: Do you have any history of heart disease? (e.g., heart attack, angina, bypass surgery,  angioplasty)      No  7. LUNG HISTORY: Do you have any history of lung disease?  (e.g., pulmonary embolus, asthma, emphysema)     COPD, Asthma  8. CAUSE: What do you think is causing the breathing problem?      Unsure  9. OTHER SYMPTOMS: Do you have any other symptoms? (e.g., chest pain, cough, dizziness, fever, runny nose)     Cough at night  10. O2 SATURATION MONITOR:  Do you use an oxygen saturation monitor (pulse oximeter) at home? If Yes, ask: What is your reading (oxygen level) today? What is your usual oxygen saturation reading? (e.g., 95%)       No  11. PREGNANCY: Is there any chance you are pregnant? When was your last menstrual period?       No and No  12. TRAVEL: Have you traveled out of the country in the last month? (e.g., travel history, exposures)       No  Protocols used: Breathing Difficulty-A-AH

## 2024-09-28 NOTE — Telephone Encounter (Signed)
Called the pt and there was no answer and no option to leave msg Will call back.

## 2024-09-28 NOTE — Telephone Encounter (Signed)
 ATC patient x3.  No answer.  No VM set up.  Mychart message sent requesting she call the office to schedule an appointment to review CT scan with NP since [facility] has no openings.                                                                                   `

## 2024-09-30 NOTE — Telephone Encounter (Unsigned)
 Copied from CRM #[id number]. Topic: Appointments - Scheduling Inquiry for Clinic >> Sep 30, 2024  9:48 AM Nathanel DEL wrote: Reason for CRM: pt calling back b/c she has not received any call back from the NT message when she called 11/11.  I let pt know the nurse had tried to call her back several (3) times since then.  But she said she had no missed calls, and she does have VM set up.  She said something must be wrong w/ her phone.(And its not like it she gets no c/b) Pt made first available appt w/ Dr Shelah (that she can make-appt around 10 am) for 12/02/2024.  Pt declined any other provider.  She states she is OK w/ that appt b/c she can at least breathe.  And if she gets worse, she will see anyone or go to ED. Advised pt I would like the dr know, and her best contact number is [phone]    -----------------------------------------------------------------------

## 2024-12-01 ENCOUNTER — Ambulatory Visit: Admitting: Emergency Medicine

## 2024-12-02 ENCOUNTER — Ambulatory Visit: Admitting: Emergency Medicine

## 2024-12-02 ENCOUNTER — Ambulatory Visit: Payer: Self-pay | Admitting: Emergency Medicine

## 2024-12-22 ENCOUNTER — Ambulatory Visit: Admitting: Emergency Medicine

## 2024-12-29 ENCOUNTER — Ambulatory Visit: Admitting: Emergency Medicine

## 2025-01-19 ENCOUNTER — Ambulatory Visit (HOSPITAL_BASED_OUTPATIENT_CLINIC_OR_DEPARTMENT_OTHER): Admitting: Cardiology
